# Patient Record
Sex: Male | Born: 1950
Health system: Southern US, Community
[De-identification: ages and names within clinical notes are randomized; demographics above are authoritative.]

## PROBLEM LIST (undated history)

## (undated) DIAGNOSIS — I219 Acute myocardial infarction, unspecified: Secondary | ICD-10-CM

## (undated) DIAGNOSIS — E119 Type 2 diabetes mellitus without complications: Secondary | ICD-10-CM

## (undated) DIAGNOSIS — I499 Cardiac arrhythmia, unspecified: Secondary | ICD-10-CM

## (undated) DIAGNOSIS — N289 Disorder of kidney and ureter, unspecified: Secondary | ICD-10-CM

## (undated) DIAGNOSIS — I10 Essential (primary) hypertension: Secondary | ICD-10-CM

## (undated) DIAGNOSIS — E785 Hyperlipidemia, unspecified: Secondary | ICD-10-CM

## (undated) HISTORY — DX: Hyperlipidemia, unspecified: E78.5

## (undated) HISTORY — DX: Type 2 diabetes mellitus without complications: E11.9

## (undated) HISTORY — DX: Cardiac arrhythmia, unspecified: I49.9

## (undated) HISTORY — DX: Essential (primary) hypertension: I10

## (undated) HISTORY — DX: Disorder of kidney and ureter, unspecified: N28.9

## (undated) HISTORY — DX: Acute myocardial infarction, unspecified: I21.9

## (undated) HISTORY — PX: OTHER SURGICAL HISTORY: SHX169

---

## 2014-12-22 ENCOUNTER — Encounter: Payer: Self-pay | Admitting: *Deleted

## 2014-12-24 ENCOUNTER — Ambulatory Visit: Payer: Self-pay | Admitting: Cardiology

## 2015-03-04 ENCOUNTER — Encounter: Payer: Self-pay | Admitting: Cardiology

## 2015-03-04 ENCOUNTER — Ambulatory Visit (INDEPENDENT_AMBULATORY_CARE_PROVIDER_SITE_OTHER): Payer: 59 | Admitting: Cardiology

## 2015-03-04 VITALS — BP 130/74 | HR 88 | Ht 70.0 in | Wt 276.4 lb

## 2015-03-04 DIAGNOSIS — N058 Unspecified nephritic syndrome with other morphologic changes: Secondary | ICD-10-CM

## 2015-03-04 DIAGNOSIS — E119 Type 2 diabetes mellitus without complications: Secondary | ICD-10-CM | POA: Insufficient documentation

## 2015-03-04 DIAGNOSIS — I481 Persistent atrial fibrillation: Secondary | ICD-10-CM

## 2015-03-04 DIAGNOSIS — Z7901 Long term (current) use of anticoagulants: Secondary | ICD-10-CM

## 2015-03-04 DIAGNOSIS — I2510 Atherosclerotic heart disease of native coronary artery without angina pectoris: Secondary | ICD-10-CM | POA: Diagnosis not present

## 2015-03-04 DIAGNOSIS — E1129 Type 2 diabetes mellitus with other diabetic kidney complication: Secondary | ICD-10-CM

## 2015-03-04 DIAGNOSIS — I2583 Coronary atherosclerosis due to lipid rich plaque: Secondary | ICD-10-CM

## 2015-03-04 DIAGNOSIS — I4819 Other persistent atrial fibrillation: Secondary | ICD-10-CM | POA: Insufficient documentation

## 2015-03-04 DIAGNOSIS — E782 Mixed hyperlipidemia: Secondary | ICD-10-CM

## 2015-03-04 MED ORDER — FENOFIBRATE 54 MG PO TABS: 54.0000 mg | ORAL_TABLET | Freq: Every day | ORAL | Status: DC

## 2015-03-04 NOTE — Patient Instructions (Addendum)
Medication Instructions:  Please decrease Fenofibrate to 54 mg a day. Continue all other medications as listed.  Please have a Digoxin level drawn at your primary care Dr's office at your next visit.  Testing/Procedures: Your physician has requested that you have an echocardiogram. Echocardiography is a painless test that uses sound waves to create images of your heart. It provides your doctor with information about the size and shape of your heart and how well your heart's chambers and valves are working. This procedure takes approximately one hour. There are no restrictions for this procedure.  Follow-Up: Follow up in 6 months with Dr. Marlou Porch.  You will receive a letter in the mail 2 months before you are due.  Please call us when you receive this letter to schedule your follow up appointment.  Thank you for choosing [facility]!!

## 2015-03-04 NOTE — [place] Notes (Signed)
Cardiology Office Note   Date:  03/04/2015   ID:  Shane Kelly, DOB 02/16/1951, MRN [id number]  PCP:  Pcp Not In System  Cardiologist:   Candee Furbish, MD       History of Present Illness: Shane Kelly is a 64 y.o. male who presents for evaluation of coronary artery disease. He has been on chronic anticoagulation, because of chronic atrial fibrillation. He has had a previous myocardial infarction and presumed ischemic cardiomyopathy based upon his spironolactone, digoxin, carvedilol drug regimen. Also has diabetes.  Prior coronary artery stent 2 on 09/25/2006 as well as stent 2 on 01/20/13, [place]. Knee replacement 06/25/13.  He is only had 2 separate episodes of chest discomfort both of which were surrounded by anxiety. Nonexertional. Brief episodes, took nitroglycerin and they were resolved.  Direct LDL 112 HDL 30  and A1c 6.6, BUN 38, creatinine 2.1    Past Medical History  Diagnosis Date  . Diabetes   . Heart attack   . Irregular heart beat   . Hypertension   . Hyperlipidemia   . Kidney disease     Past Surgical History  Procedure Laterality Date  . Right hip replacement    . Stents  2008/2014  . Left knee replacement       Current Outpatient Prescriptions  Medication Sig Dispense Refill  . allopurinol (ZYLOPRIM) 300 MG tablet Take 300 mg by mouth daily.    Marland Kitchen atorvastatin (LIPITOR) 80 MG tablet Take 80 mg by mouth daily.    . bisacodyl (DULCOLAX) 5 MG EC tablet Take 10 mg by mouth daily as needed for moderate constipation.    Marland Kitchen buPROPion (WELLBUTRIN XL) 150 MG 24 hr tablet Take 150 mg by mouth daily.    . carvedilol (COREG) 6.25 MG tablet Take 6.25 mg by mouth 2 (two) times daily with a meal.    . digoxin (LANOXIN) 0.125 MG tablet Take 0.125 mg by mouth daily.    Marland Kitchen diltiazem (CARDIZEM SR) 60 MG 12 hr capsule Take 60 mg by mouth 2 (two) times daily.    . fenofibrate 160 MG tablet Take 160 mg by mouth daily.    . furosemide (LASIX) 20 MG tablet Take  20 mg by mouth.    Marland Kitchen glimepiride (AMARYL) 2 MG tablet Take 2 mg by mouth daily with breakfast.    . HYDROcodone-acetaminophen (NORCO/VICODIN) 5-325 MG per tablet Take 1 tablet by mouth every 6 (six) hours as needed for moderate pain.    Marland Kitchen insulin aspart protamine- aspart (NOVOLOG MIX 70/30) (70-30) 100 UNIT/ML injection Inject into the skin. 18 units AM and 14 units PM    . isosorbide mononitrate (IMDUR) 60 MG 24 hr tablet Take 60 mg by mouth daily.    Marland Kitchen losartan (COZAAR) 100 MG tablet Take 100 mg by mouth daily.    . nitroGLYCERIN (NITROSTAT) 0.4 MG SL tablet Place 0.4 mg under the tongue every 5 (five) minutes as needed for chest pain.    Marland Kitchen spironolactone (ALDACTONE) 25 MG tablet Take 25 mg by mouth daily.    Marland Kitchen warfarin (COUMADIN) 2.5 MG tablet Take 2.5 mg by mouth daily. TAKE 1 TABLET MON, WED, FRI AND A 1/2 TABLET TUES, THURS, SAT AND SUN     No current facility-administered medications for this visit.    Allergies:   Review of patient's allergies indicates no known allergies.    Social History:  The patient  reports that he has never smoked. He does not have any smokeless tobacco  history on file. He reports that he drinks alcohol. He reports that he does not use illicit drugs.   Family History:  The patient's family history includes Diabetes in his father; Heart disease in his father and mother. There is no history of Cancer.    ROS:  Please see the history of present illness.   Otherwise, review of systems are positive for none. He has had a persistent cough for a few weeks. Depression. Back pain, knee pain, irregular heartbeat.  All other systems are reviewed and negative.    PHYSICAL EXAM: VS:  BP 130/74 mmHg  Pulse 88  Ht 5\' 10"  (1.778 m)  Wt 276 lb 6.4 oz (125.374 kg)  BMI 39.66 kg/m2 , BMI Body mass index is 39.66 kg/(m^2). GEN: Well nourished, well developed, in no acute distress HEENT: normal Neck: no JVD, carotid bruits, or masses Cardiac: irreg irreg; no murmurs, rubs,  or gallops,no edema  Respiratory:  clear to auscultation bilaterally, normal work of breathing GI: soft, nontender, nondistended, + BS, obese MS: no deformity or atrophy Skin: warm and dry, no rash, left knee scar Neuro:  Strength and sensation are intact Psych: euthymic mood, full affect   EKG:  EKG is ordered today. The ekg ordered today demonstrates 03/04/15-atrial fibrillation, heart rate 88, PVCs, left anterior fascicular block, personally viewed   Recent Labs: No results found for requested labs within last 365 days.    Lipid Panel No results found for: CHOL, TRIG, HDL, CHOLHDL, VLDL, LDLCALC, LDLDIRECT    Wt Readings from Last 3 Encounters:  03/04/15 276 lb 6.4 oz (125.374 kg)      Other studies Reviewed: Additional studies/ records that were reviewed today include: Lab work, office notes, EKG. Review of the above records demonstrates: as above   ASSESSMENT AND PLAN:  1.  Atrial fibrillation - currently well rate controlled on carvedilol as well as digoxin. Please check a digoxin level, Dr. Nancy Fetter, at clinic appointment (he desire to get the lab work done there since he was going to be getting other lab work done as well.) Check echocardiogram.  2. Chronic anticoagulation-since co-pay is less expensive at Dr. Nancy Fetter, he will talk to them about checking INR. If necessary, we are more than happy to help her with Coumadin checks. Last INR was 2.5.  3. Coronary artery disease-for total stents placed. Last 2014, [place]. Overall doing reasonably well. One bout recent of chest discomfort in the setting of anxiety/stress. If this becomes more apparent, more frequent, he knows to call me and we will set up a stress test for him.  4. Morbid obesity- continue to encourage weight loss. He used to enjoy swimming activities.  5. Diabetes-continue to work on further control.  6. Mixed hyperlipidemia-since his GFR is 34, creatinine 2.1, we will dose adjust Fenofibrate to  54 mg. Spoke with our pharmacist. As diabetes becomes better controlled, triglycerides will become better controlled as well. Encourage weight loss.   Current medicines are reviewed at length with the patient today.  The patient does not have concerns regarding medicines.  The following changes have been made:  Decrased dose of Fenofibrate  Labs/ tests ordered today include: Please check DIGOXIN level at initial appt.   No orders of the defined types were placed in this encounter.     Disposition:   FU with Devone Tousley in 6 months  Signed, Candee Furbish, MD  03/04/2015 4:06 PM    [facility] [place],  [place]  [address] Phone: [phone]; Fax: [phone]

## 2015-03-07 MED ORDER — FENOFIBRATE 54 MG PO TABS: 54.0000 mg | ORAL_TABLET | Freq: Every day | ORAL | Status: DC

## 2015-03-07 NOTE — Addendum Note (Signed)
Addended by: Marlis Edelson C on: 03/07/2015 04:49 PM   Modules accepted: Orders

## 2015-04-06 ENCOUNTER — Other Ambulatory Visit (HOSPITAL_COMMUNITY): Payer: 59

## 2015-04-07 ENCOUNTER — Telehealth (HOSPITAL_COMMUNITY): Payer: Self-pay | Admitting: *Deleted

## 2015-04-22 ENCOUNTER — Ambulatory Visit (HOSPITAL_COMMUNITY): Payer: 59 | Attending: Cardiovascular Disease

## 2015-04-22 ENCOUNTER — Other Ambulatory Visit: Payer: Self-pay

## 2015-04-22 DIAGNOSIS — Z8249 Family history of ischemic heart disease and other diseases of the circulatory system: Secondary | ICD-10-CM | POA: Diagnosis not present

## 2015-04-22 DIAGNOSIS — I517 Cardiomegaly: Secondary | ICD-10-CM | POA: Insufficient documentation

## 2015-04-22 DIAGNOSIS — I481 Persistent atrial fibrillation: Secondary | ICD-10-CM

## 2015-04-22 DIAGNOSIS — I4819 Other persistent atrial fibrillation: Secondary | ICD-10-CM

## 2015-04-22 DIAGNOSIS — I4891 Unspecified atrial fibrillation: Secondary | ICD-10-CM | POA: Insufficient documentation

## 2015-04-22 DIAGNOSIS — E785 Hyperlipidemia, unspecified: Secondary | ICD-10-CM | POA: Insufficient documentation

## 2015-04-22 DIAGNOSIS — I10 Essential (primary) hypertension: Secondary | ICD-10-CM | POA: Diagnosis not present

## 2015-04-22 DIAGNOSIS — E119 Type 2 diabetes mellitus without complications: Secondary | ICD-10-CM | POA: Insufficient documentation

## 2015-09-06 ENCOUNTER — Ambulatory Visit (INDEPENDENT_AMBULATORY_CARE_PROVIDER_SITE_OTHER): Payer: BLUE CROSS/BLUE SHIELD | Admitting: Cardiology

## 2015-09-06 ENCOUNTER — Encounter: Payer: Self-pay | Admitting: Cardiology

## 2015-09-06 VITALS — BP 130/88 | HR 88 | Ht 70.0 in | Wt 286.1 lb

## 2015-09-06 DIAGNOSIS — Z7901 Long term (current) use of anticoagulants: Secondary | ICD-10-CM

## 2015-09-06 DIAGNOSIS — I4819 Other persistent atrial fibrillation: Secondary | ICD-10-CM

## 2015-09-06 DIAGNOSIS — I2510 Atherosclerotic heart disease of native coronary artery without angina pectoris: Secondary | ICD-10-CM

## 2015-09-06 DIAGNOSIS — I2583 Coronary atherosclerosis due to lipid rich plaque: Secondary | ICD-10-CM

## 2015-09-06 DIAGNOSIS — I481 Persistent atrial fibrillation: Secondary | ICD-10-CM

## 2015-09-06 DIAGNOSIS — E782 Mixed hyperlipidemia: Secondary | ICD-10-CM

## 2015-09-06 NOTE — Patient Instructions (Signed)
Medication Instructions:  Your physician recommends that you continue on your current medications as directed. Please refer to the Current Medication list given to you today.   Labwork: None ordered  Testing/Procedures: None ordered  Follow-Up: Your physician wants you to follow-up in: 6 months with Dr.Skains You will receive a reminder letter in the mail two months in advance. If you don't receive a letter, please call our office to schedule the follow-up appointment.   Any Other Special Instructions Will Be Listed Below (If Applicable).     If you need a refill on your cardiac medications before your next appointment, please call your pharmacy.   

## 2015-09-06 NOTE — [place] Notes (Signed)
Cardiology Office Note   Date:  09/06/2015   ID:  Shane Kelly, DOB 1951/03/17, MRN [id number]  PCP:  Lynne Logan, MD  Cardiologist:   Candee Furbish, MD       History of Present Illness: Shane Kelly is a 65 y.o. male who presents for evaluation of coronary artery disease. He has been on chronic anticoagulation, because of chronic atrial fibrillation. He has had a previous myocardial infarction and presumed ischemic cardiomyopathy based upon his spironolactone, digoxin, carvedilol drug regimen. Also has diabetes.  Prior coronary artery stent 2 on 09/25/2006 as well as stent 2 on 01/20/13, [place]. Knee replacement 06/25/13.  He is only had 2 separate episodes of chest discomfort both of which were surrounded by anxiety. Nonexertional. Brief episodes, took nitroglycerin and they were resolved.  No recent episodes. Overall he has been worried about his wife who is going to undergo aortic valve replacement. He has been eating more, stress eating. Weight is increased.  Direct LDL 112 HDL 30  and A1c 6.6, BUN 38, creatinine 2.1. He is getting more recent blood work on the 22nd of this month.    Past Medical History  Diagnosis Date  . Diabetes ([place])   . Heart attack ([place])   . Irregular heart beat   . Hypertension   . Hyperlipidemia   . Kidney disease     Past Surgical History  Procedure Laterality Date  . Right hip replacement    . Stents  2008/2014  . Left knee replacement       Current Outpatient Prescriptions  Medication Sig Dispense Refill  . allopurinol (ZYLOPRIM) 300 MG tablet Take 300 mg by mouth daily.    Marland Kitchen atorvastatin (LIPITOR) 80 MG tablet Take 80 mg by mouth daily.    . bisacodyl (DULCOLAX) 5 MG EC tablet Take 10 mg by mouth daily as needed for moderate constipation.    Marland Kitchen buPROPion (WELLBUTRIN XL) 150 MG 24 hr tablet Take 150 mg by mouth daily.    . carvedilol (COREG) 6.25 MG tablet Take 6.25 mg by mouth 2 (two) times daily with a meal.    .  cetirizine (ZYRTEC) 10 MG tablet Take 10 mg by mouth daily.    . digoxin (LANOXIN) 0.125 MG tablet Take 0.125 mg by mouth daily.    Marland Kitchen diltiazem (CARDIZEM SR) 60 MG 12 hr capsule Take 60 mg by mouth 2 (two) times daily.    . fenofibrate 160 MG tablet Take 160 mg by mouth daily.   0  . furosemide (LASIX) 20 MG tablet Take 20 mg by mouth.    Marland Kitchen glimepiride (AMARYL) 2 MG tablet Take 2 mg by mouth daily with breakfast.    . HYDROcodone-acetaminophen (NORCO/VICODIN) 5-325 MG per tablet Take 1 tablet by mouth every 6 (six) hours as needed for moderate pain.    Marland Kitchen insulin aspart protamine- aspart (NOVOLOG MIX 70/30) (70-30) 100 UNIT/ML injection Inject 30 Units into the skin 2 (two) times daily with a meal. 18 units AM and 14 units PM    . isosorbide mononitrate (IMDUR) 60 MG 24 hr tablet Take 60 mg by mouth daily.    Marland Kitchen losartan (COZAAR) 100 MG tablet Take 100 mg by mouth daily.    . nitroGLYCERIN (NITROSTAT) 0.4 MG SL tablet Place 0.4 mg under the tongue every 5 (five) minutes as needed for chest pain.    Marland Kitchen spironolactone (ALDACTONE) 25 MG tablet Take 25 mg by mouth daily.    Marland Kitchen warfarin (COUMADIN) 2.5 MG  tablet Take 2.5 mg by mouth daily. TAKE 1 TABLET MON, WED, FRI AND A 1/2 TABLET TUES, THURS, SAT AND SUN     No current facility-administered medications for this visit.    Allergies:   Review of patient's allergies indicates no known allergies.    Social History:  The patient  reports that he has never smoked. He does not have any smokeless tobacco history on file. He reports that he drinks alcohol. He reports that he does not use illicit drugs.   Family History:  The patient's family history includes Diabetes in his father; Heart disease in his father and mother. There is no history of Cancer.    ROS:  Please see the history of present illness.   Otherwise, review of systems are positive for none. He has had a persistent cough for a few weeks. Depression. Back pain, knee pain, irregular heartbeat.   All other systems are reviewed and negative.    PHYSICAL EXAM: VS:  BP 130/88 mmHg  Pulse 88  Ht 5\' 10"  (1.778 m)  Wt 286 lb 1.9 oz (129.783 kg)  BMI 41.05 kg/m2  SpO2 98% , BMI Body mass index is 41.05 kg/(m^2). GEN: Well nourished, well developed, in no acute distress HEENT: normal Neck: no JVD, carotid bruits, or masses Cardiac: irreg irreg; no murmurs, rubs, or gallops,no edema  Respiratory:  clear to auscultation bilaterally, normal work of breathing GI: soft, nontender, nondistended, + BS, obese MS: no deformity or atrophy Skin: warm and dry, no rash, left knee scar Neuro:  Strength and sensation are intact Psych: euthymic mood, full affect   EKG:  EKG is ordered today. The ekg ordered today demonstrates 03/04/15-atrial fibrillation, heart rate 88, PVCs, left anterior fascicular block, personally viewed   Recent Labs: No results found for requested labs within last 365 days.    Lipid Panel No results found for: CHOL, TRIG, HDL, CHOLHDL, VLDL, LDLCALC, LDLDIRECT    Wt Readings from Last 3 Encounters:  09/06/15 286 lb 1.9 oz (129.783 kg)  03/04/15 276 lb 6.4 oz (125.374 kg)      Other studies Reviewed: Additional studies/ records that were reviewed today include: Lab work, office notes, EKG. Review of the above records demonstrates: as above   ASSESSMENT AND PLAN:  1.  Atrial fibrillation - currently well rate controlled on carvedilol as well as digoxin. Echocardiogram reassuring, moderate left atrial enlargement overall he is doing well with this. We discussed weight loss.  2. Chronic anticoagulation- Dr. Nancy Fetter, he will talk to them about checking INR.   3. Coronary artery disease - 4 total stents placed. Last 2014, [place]. Overall doing reasonably well. No CP.  4. Morbid obesity- continue to encourage weight loss. He used to enjoy swimming activities. His wife is undergoing aortic valve replacement. Stress eating. He has gained weight.   5.  Diabetes-continue to work on further control.  6. Mixed hyperlipidemia-since his GFR is 34, prior creatinine 2.1, we will dose adjust Fenofibrate to 54 mg. Spoke with our pharmacist.  Encourage weight loss. I think he may have still been taking the 160 mg dosage. Continue to monitor function closely, Dr. Nancy Fetter. Especially with kidney function as well as spironolactone use and digoxin.    Current medicines are reviewed at length with the patient today.  The patient does not have concerns regarding medicines.     No orders of the defined types were placed in this encounter.     Disposition:   FU with [facility]  in 6 months  Signed, Candee Furbish, MD  09/06/2015 9:21 AM    [facility] [place], [place], [place]  [address] Phone: [phone]; Fax: [phone]

## 2015-10-25 DIAGNOSIS — M5431 Sciatica, right side: Secondary | ICD-10-CM | POA: Diagnosis not present

## 2015-10-25 DIAGNOSIS — Z794 Long term (current) use of insulin: Secondary | ICD-10-CM | POA: Diagnosis not present

## 2015-10-25 DIAGNOSIS — Z7984 Long term (current) use of oral hypoglycemic drugs: Secondary | ICD-10-CM | POA: Diagnosis not present

## 2015-10-25 DIAGNOSIS — Z7901 Long term (current) use of anticoagulants: Secondary | ICD-10-CM | POA: Diagnosis not present

## 2015-10-25 DIAGNOSIS — E1122 Type 2 diabetes mellitus with diabetic chronic kidney disease: Secondary | ICD-10-CM | POA: Diagnosis not present

## 2015-10-25 DIAGNOSIS — M25569 Pain in unspecified knee: Secondary | ICD-10-CM | POA: Diagnosis not present

## 2015-11-08 DIAGNOSIS — Z7901 Long term (current) use of anticoagulants: Secondary | ICD-10-CM | POA: Diagnosis not present

## 2015-11-08 DIAGNOSIS — I509 Heart failure, unspecified: Secondary | ICD-10-CM | POA: Diagnosis not present

## 2015-12-15 DIAGNOSIS — Z7901 Long term (current) use of anticoagulants: Secondary | ICD-10-CM | POA: Diagnosis not present

## 2015-12-15 DIAGNOSIS — I4891 Unspecified atrial fibrillation: Secondary | ICD-10-CM | POA: Diagnosis not present

## 2015-12-22 DIAGNOSIS — E1165 Type 2 diabetes mellitus with hyperglycemia: Secondary | ICD-10-CM | POA: Diagnosis not present

## 2015-12-22 DIAGNOSIS — E7800 Pure hypercholesterolemia, unspecified: Secondary | ICD-10-CM | POA: Diagnosis not present

## 2015-12-22 DIAGNOSIS — N189 Chronic kidney disease, unspecified: Secondary | ICD-10-CM | POA: Diagnosis not present

## 2015-12-22 DIAGNOSIS — I10 Essential (primary) hypertension: Secondary | ICD-10-CM | POA: Diagnosis not present

## 2015-12-22 DIAGNOSIS — E669 Obesity, unspecified: Secondary | ICD-10-CM | POA: Diagnosis not present

## 2015-12-29 DIAGNOSIS — E1165 Type 2 diabetes mellitus with hyperglycemia: Secondary | ICD-10-CM | POA: Diagnosis not present

## 2016-02-08 DIAGNOSIS — E1122 Type 2 diabetes mellitus with diabetic chronic kidney disease: Secondary | ICD-10-CM | POA: Diagnosis not present

## 2016-02-08 DIAGNOSIS — I10 Essential (primary) hypertension: Secondary | ICD-10-CM | POA: Diagnosis not present

## 2016-02-08 DIAGNOSIS — E782 Mixed hyperlipidemia: Secondary | ICD-10-CM | POA: Diagnosis not present

## 2016-02-08 DIAGNOSIS — F329 Major depressive disorder, single episode, unspecified: Secondary | ICD-10-CM | POA: Diagnosis not present

## 2016-02-08 DIAGNOSIS — I509 Heart failure, unspecified: Secondary | ICD-10-CM | POA: Diagnosis not present

## 2016-02-08 DIAGNOSIS — Z7901 Long term (current) use of anticoagulants: Secondary | ICD-10-CM | POA: Diagnosis not present

## 2016-02-08 DIAGNOSIS — N183 Chronic kidney disease, stage 3 (moderate): Secondary | ICD-10-CM | POA: Diagnosis not present

## 2016-02-08 DIAGNOSIS — M5431 Sciatica, right side: Secondary | ICD-10-CM | POA: Diagnosis not present

## 2016-02-14 ENCOUNTER — Encounter: Payer: Self-pay | Admitting: Cardiology

## 2016-02-15 DIAGNOSIS — Z7901 Long term (current) use of anticoagulants: Secondary | ICD-10-CM | POA: Diagnosis not present

## 2016-02-15 DIAGNOSIS — I4891 Unspecified atrial fibrillation: Secondary | ICD-10-CM | POA: Diagnosis not present

## 2016-02-24 DIAGNOSIS — I4891 Unspecified atrial fibrillation: Secondary | ICD-10-CM | POA: Diagnosis not present

## 2016-02-24 DIAGNOSIS — Z7901 Long term (current) use of anticoagulants: Secondary | ICD-10-CM | POA: Diagnosis not present

## 2016-03-05 ENCOUNTER — Encounter (INDEPENDENT_AMBULATORY_CARE_PROVIDER_SITE_OTHER): Payer: Self-pay

## 2016-03-05 ENCOUNTER — Encounter: Payer: Self-pay | Admitting: Cardiology

## 2016-03-05 ENCOUNTER — Ambulatory Visit (INDEPENDENT_AMBULATORY_CARE_PROVIDER_SITE_OTHER): Payer: Medicare Other | Admitting: Cardiology

## 2016-03-05 VITALS — BP 116/72 | HR 80 | Ht 70.0 in | Wt 286.8 lb

## 2016-03-05 DIAGNOSIS — I481 Persistent atrial fibrillation: Secondary | ICD-10-CM | POA: Diagnosis not present

## 2016-03-05 DIAGNOSIS — I2583 Coronary atherosclerosis due to lipid rich plaque: Secondary | ICD-10-CM

## 2016-03-05 DIAGNOSIS — I4819 Other persistent atrial fibrillation: Secondary | ICD-10-CM

## 2016-03-05 DIAGNOSIS — I2510 Atherosclerotic heart disease of native coronary artery without angina pectoris: Secondary | ICD-10-CM | POA: Diagnosis not present

## 2016-03-05 DIAGNOSIS — E782 Mixed hyperlipidemia: Secondary | ICD-10-CM

## 2016-03-05 DIAGNOSIS — Z7901 Long term (current) use of anticoagulants: Secondary | ICD-10-CM

## 2016-03-05 NOTE — Patient Instructions (Signed)
Medication Instructions:  You may stop your Digoxin. Continue all other medications as listed.  Follow-Up: Follow up in 6 months with Truitt Merle, NP.  You will receive a letter in the mail 2 months before you are due.  Please call us when you receive this letter to schedule your follow up appointment.  Follow up in 1 year with Dr. Marlou Porch.  You will receive a letter in the mail 2 months before you are due.  Please call us when you receive this letter to schedule your follow up appointment.   If you need a refill on your cardiac medications before your next appointment, please call your pharmacy.  Thank you for choosing [facility]!!

## 2016-03-05 NOTE — [place] Notes (Signed)
Cardiology Office Note   Date:  03/05/2016   ID:  Shane Kelly, DOB 1951/03/13, MRN [id number]  PCP:  Lynne Logan, MD  Cardiologist:   Candee Furbish, MD       History of Present Illness: Shane Kelly is a 65 y.o. male who presents for follow up of coronary artery disease. He has been on chronic anticoagulation, because of chronic atrial fibrillation. He has had a previous myocardial infarction and presumed ischemic cardiomyopathy based upon his spironolactone, digoxin (stopped on 03/05/16), carvedilol drug regimen. Also has diabetes.  Prior coronary artery stent 2 on 09/25/2006 as well as stent 2 on 01/20/13, [place]. Knee replacement 06/25/13.  He is only had 2 separate episodes of chest discomfort both of which were surrounded by anxiety. Nonexertional. Brief episodes, took nitroglycerin and they were resolved.  No recent episodes. His wife underwent aortic valve replacement. He has been eating more, stress eating. Weight is increased.  Direct LDL 112 HDL 30  and A1c 6.6, BUN 38, creatinine 2.1.  Overall no new complaints. No orthopnea, no syncope, no chest pain   Past Medical History:  Diagnosis Date  . Diabetes ([place])   . Heart attack ([facility])   . Hyperlipidemia   . Hypertension   . Irregular heart beat   . Kidney disease     Past Surgical History:  Procedure Laterality Date  . LEFT KNEE REPLACEMENT    . RIGHT HIP REPLACEMENT    . STENTS  2008/2014     Current Outpatient Prescriptions  Medication Sig Dispense Refill  . allopurinol (ZYLOPRIM) 300 MG tablet Take 300 mg by mouth daily.    Marland Kitchen atorvastatin (LIPITOR) 80 MG tablet Take 80 mg by mouth daily.    Marland Kitchen buPROPion (WELLBUTRIN XL) 150 MG 24 hr tablet Take 150 mg by mouth daily.    . carvedilol (COREG) 6.25 MG tablet Take 6.25 mg by mouth 2 (two) times daily with a meal.    . cetirizine (ZYRTEC) 10 MG tablet Take 10 mg by mouth daily.    Marland Kitchen diltiazem (CARDIZEM SR) 60 MG 12 hr capsule Take 60 mg by mouth 2  (two) times daily.    . fenofibrate 160 MG tablet Take 160 mg by mouth daily.   0  . furosemide (LASIX) 20 MG tablet Take 20 mg by mouth.    Marland Kitchen HYDROcodone-acetaminophen (NORCO/VICODIN) 5-325 MG per tablet Take 1 tablet by mouth every 6 (six) hours as needed for moderate pain.    Marland Kitchen insulin aspart protamine- aspart (NOVOLOG MIX 70/30) (70-30) 100 UNIT/ML injection Inject 30 Units into the skin 2 (two) times daily with a meal. 18 units AM and 14 units PM    . isosorbide mononitrate (IMDUR) 60 MG 24 hr tablet Take 60 mg by mouth daily.    Marland Kitchen losartan (COZAAR) 100 MG tablet Take 100 mg by mouth daily.    . nitroGLYCERIN (NITROSTAT) 0.4 MG SL tablet Place 0.4 mg under the tongue every 5 (five) minutes as needed for chest pain.    Marland Kitchen spironolactone (ALDACTONE) 25 MG tablet Take 25 mg by mouth daily.    Marland Kitchen warfarin (COUMADIN) 2.5 MG tablet Take 2.5 mg by mouth daily. TAKE 1 TABLET MON, WED, FRI AND A 1/2 TABLET TUES, THURS, SAT AND SUN     No current facility-administered medications for this visit.     Allergies:   Review of patient's allergies indicates no known allergies.    Social History:  The patient  reports that he has  never smoked. He has never used smokeless tobacco. He reports that he drinks alcohol. He reports that he does not use drugs.   Family History:  The patient's family history includes Diabetes in his father; Heart disease in his father and mother.    ROS:  Please see the history of present illness.   Otherwise, review of systems are positive for none. He has had a persistent cough for a few weeks. Depression. Back pain, knee pain, irregular heartbeat.  All other systems are reviewed and negative.    PHYSICAL EXAM: VS:  BP 116/72   Pulse 80   Ht 5\' 10"  (1.778 m)   Wt 286 lb 12.8 oz (130.1 kg)   BMI 41.15 kg/m  , BMI Body mass index is 41.15 kg/m. GEN: Well nourished, well developed, in no acute distress  HEENT: normal  Neck: no JVD, carotid bruits, or masses Cardiac:  irreg irreg; no murmurs, rubs, or gallops,no edema  Respiratory:  clear to auscultation bilaterally, normal work of breathing GI: soft, nontender, nondistended, + BS, obese MS: no deformity or atrophy  Skin: warm and dry, no rash, left knee scar Neuro:  Strength and sensation are intact Psych: euthymic mood, full affect   EKG:  EKG is ordered today. The ekg ordered today demonstrates 03/05/16-atrial fibrillation, PVCs noted, ventricular rate 72 bpm, left anterior fascicular block personally viewed-no change from prior-03/04/15-atrial fibrillation, heart rate 88, PVCs, left anterior fascicular block, personally viewed   Recent Labs: No results found for requested labs within last 8760 hours.    Lipid Panel No results found for: CHOL, TRIG, HDL, CHOLHDL, VLDL, LDLCALC, LDLDIRECT    Wt Readings from Last 3 Encounters:  03/05/16 286 lb 12.8 oz (130.1 kg)  09/06/15 286 lb 1.9 oz (129.8 kg)  03/04/15 276 lb 6.4 oz (125.4 kg)      Other studies Reviewed: Additional studies/ records that were reviewed today include: Lab work, office notes, EKG. Review of the above records demonstrates: as above   ASSESSMENT AND PLAN:  1.  Atrial fibrillation - currently well rate controlled on carvedilol, Diltiazem as well as digoxin. I'm going to stop his digoxin. We seldom uses this for rate control and atrial fibrillation at this point. Also with his renal function I think this would be wise. Echocardiogram reassuring, moderate left atrial enlargement overall he is doing well with this. We discussed weight loss.  2. Chronic anticoagulation- Dr. Nancy Fetter, INR.   3. Coronary artery disease - 4 total stents placed. Last 2014, [facility]. Overall doing reasonably well. No CP.  4. Morbid obesity- continue to encourage weight loss. He used to enjoy swimming activities. His wife had aortic valve replacement.    5. Diabetes-continue to work on further control. Dr. Chalmers Cater.  6. Mixed  hyperlipidemia-since his GFR is 34, prior creatinine 2.1,  dose adjusted Fenofibrate to 54 mg. Spoke with our pharmacist.  Encourage weight loss. I think he may have still been taking the 160 mg dosage. Continue to monitor function closely, Dr. Nancy Fetter. Especially with kidney function as well as spironolactone use. Stated that it may not be a bad idea for him to see a nephrologist as well.    Current medicines are reviewed at length with the patient today.  The patient does not have concerns regarding medicines.      Orders Placed This Encounter  Procedures  . EKG 12-Lead     Disposition:   FU with Cecille Rubin in 6 months me in one year  Signed,  Candee Furbish, MD  03/05/2016 9:14 AM    [facility] [place], [place], [place]  [address] Phone: [phone]; Fax: [phone]

## 2016-03-07 DIAGNOSIS — I4891 Unspecified atrial fibrillation: Secondary | ICD-10-CM | POA: Diagnosis not present

## 2016-03-07 DIAGNOSIS — Z7901 Long term (current) use of anticoagulants: Secondary | ICD-10-CM | POA: Diagnosis not present

## 2016-03-21 DIAGNOSIS — Z7901 Long term (current) use of anticoagulants: Secondary | ICD-10-CM | POA: Diagnosis not present

## 2016-03-21 DIAGNOSIS — I4891 Unspecified atrial fibrillation: Secondary | ICD-10-CM | POA: Diagnosis not present

## 2016-03-23 DIAGNOSIS — E1165 Type 2 diabetes mellitus with hyperglycemia: Secondary | ICD-10-CM | POA: Diagnosis not present

## 2016-03-23 DIAGNOSIS — I10 Essential (primary) hypertension: Secondary | ICD-10-CM | POA: Diagnosis not present

## 2016-03-23 DIAGNOSIS — N189 Chronic kidney disease, unspecified: Secondary | ICD-10-CM | POA: Diagnosis not present

## 2016-03-23 DIAGNOSIS — E7800 Pure hypercholesterolemia, unspecified: Secondary | ICD-10-CM | POA: Diagnosis not present

## 2016-04-11 ENCOUNTER — Encounter: Payer: Self-pay | Admitting: *Deleted

## 2016-04-12 DIAGNOSIS — I4891 Unspecified atrial fibrillation: Secondary | ICD-10-CM | POA: Diagnosis not present

## 2016-04-12 DIAGNOSIS — Z7901 Long term (current) use of anticoagulants: Secondary | ICD-10-CM | POA: Diagnosis not present

## 2016-05-17 DIAGNOSIS — M5431 Sciatica, right side: Secondary | ICD-10-CM | POA: Diagnosis not present

## 2016-05-17 DIAGNOSIS — Z23 Encounter for immunization: Secondary | ICD-10-CM | POA: Diagnosis not present

## 2016-05-17 DIAGNOSIS — I10 Essential (primary) hypertension: Secondary | ICD-10-CM | POA: Diagnosis not present

## 2016-05-17 DIAGNOSIS — E782 Mixed hyperlipidemia: Secondary | ICD-10-CM | POA: Diagnosis not present

## 2016-05-17 DIAGNOSIS — I4891 Unspecified atrial fibrillation: Secondary | ICD-10-CM | POA: Diagnosis not present

## 2016-05-17 DIAGNOSIS — Z7901 Long term (current) use of anticoagulants: Secondary | ICD-10-CM | POA: Diagnosis not present

## 2016-05-17 DIAGNOSIS — E1122 Type 2 diabetes mellitus with diabetic chronic kidney disease: Secondary | ICD-10-CM | POA: Diagnosis not present

## 2016-05-17 DIAGNOSIS — N183 Chronic kidney disease, stage 3 (moderate): Secondary | ICD-10-CM | POA: Diagnosis not present

## 2016-06-18 DIAGNOSIS — Z7901 Long term (current) use of anticoagulants: Secondary | ICD-10-CM | POA: Diagnosis not present

## 2016-06-18 DIAGNOSIS — I4891 Unspecified atrial fibrillation: Secondary | ICD-10-CM | POA: Diagnosis not present

## 2016-07-13 ENCOUNTER — Other Ambulatory Visit: Payer: Self-pay | Admitting: Nephrology

## 2016-07-13 DIAGNOSIS — I4891 Unspecified atrial fibrillation: Secondary | ICD-10-CM | POA: Diagnosis not present

## 2016-07-13 DIAGNOSIS — E1129 Type 2 diabetes mellitus with other diabetic kidney complication: Secondary | ICD-10-CM | POA: Diagnosis not present

## 2016-07-13 DIAGNOSIS — N183 Chronic kidney disease, stage 3 unspecified: Secondary | ICD-10-CM

## 2016-07-13 DIAGNOSIS — D649 Anemia, unspecified: Secondary | ICD-10-CM | POA: Diagnosis not present

## 2016-07-13 DIAGNOSIS — I10 Essential (primary) hypertension: Secondary | ICD-10-CM | POA: Diagnosis not present

## 2016-07-13 DIAGNOSIS — N2581 Secondary hyperparathyroidism of renal origin: Secondary | ICD-10-CM | POA: Diagnosis not present

## 2016-07-25 ENCOUNTER — Ambulatory Visit
Admission: RE | Admit: 2016-07-25 | Discharge: 2016-07-25 | Disposition: A | Discharge status: Home / Self Care | Payer: Medicare Other | Source: Ambulatory Visit | Attending: Nephrology | Admitting: Nephrology

## 2016-07-25 DIAGNOSIS — Z7901 Long term (current) use of anticoagulants: Secondary | ICD-10-CM | POA: Diagnosis not present

## 2016-07-25 DIAGNOSIS — N183 Chronic kidney disease, stage 3 unspecified: Secondary | ICD-10-CM

## 2016-07-25 DIAGNOSIS — I4891 Unspecified atrial fibrillation: Secondary | ICD-10-CM | POA: Diagnosis not present

## 2016-08-13 DIAGNOSIS — Z7901 Long term (current) use of anticoagulants: Secondary | ICD-10-CM | POA: Diagnosis not present

## 2016-08-13 DIAGNOSIS — I4891 Unspecified atrial fibrillation: Secondary | ICD-10-CM | POA: Diagnosis not present

## 2016-08-20 DIAGNOSIS — N184 Chronic kidney disease, stage 4 (severe): Secondary | ICD-10-CM | POA: Diagnosis not present

## 2016-08-20 DIAGNOSIS — E782 Mixed hyperlipidemia: Secondary | ICD-10-CM | POA: Diagnosis not present

## 2016-08-20 DIAGNOSIS — I4891 Unspecified atrial fibrillation: Secondary | ICD-10-CM | POA: Diagnosis not present

## 2016-08-20 DIAGNOSIS — I8392 Asymptomatic varicose veins of left lower extremity: Secondary | ICD-10-CM | POA: Diagnosis not present

## 2016-08-20 DIAGNOSIS — M1A9XX0 Chronic gout, unspecified, without tophus (tophi): Secondary | ICD-10-CM | POA: Diagnosis not present

## 2016-08-20 DIAGNOSIS — Z7901 Long term (current) use of anticoagulants: Secondary | ICD-10-CM | POA: Diagnosis not present

## 2016-08-20 DIAGNOSIS — I10 Essential (primary) hypertension: Secondary | ICD-10-CM | POA: Diagnosis not present

## 2016-08-20 DIAGNOSIS — I509 Heart failure, unspecified: Secondary | ICD-10-CM | POA: Diagnosis not present

## 2016-08-20 DIAGNOSIS — F329 Major depressive disorder, single episode, unspecified: Secondary | ICD-10-CM | POA: Diagnosis not present

## 2016-08-20 DIAGNOSIS — Z794 Long term (current) use of insulin: Secondary | ICD-10-CM | POA: Diagnosis not present

## 2016-08-20 DIAGNOSIS — E1122 Type 2 diabetes mellitus with diabetic chronic kidney disease: Secondary | ICD-10-CM | POA: Diagnosis not present

## 2016-08-20 DIAGNOSIS — M5431 Sciatica, right side: Secondary | ICD-10-CM | POA: Diagnosis not present

## 2016-09-03 ENCOUNTER — Other Ambulatory Visit: Payer: Self-pay | Admitting: Cardiology

## 2016-09-03 MED ORDER — ISOSORBIDE MONONITRATE ER 60 MG PO TB24: 60.0000 mg | ORAL_TABLET | Freq: Every day | ORAL | 5 refills | Status: DC

## 2016-09-05 ENCOUNTER — Ambulatory Visit: Payer: Medicare Other | Admitting: Nurse Practitioner

## 2016-09-10 DIAGNOSIS — I4891 Unspecified atrial fibrillation: Secondary | ICD-10-CM | POA: Diagnosis not present

## 2016-09-10 DIAGNOSIS — Z7901 Long term (current) use of anticoagulants: Secondary | ICD-10-CM | POA: Diagnosis not present

## 2016-09-12 ENCOUNTER — Ambulatory Visit: Payer: Medicare Other | Admitting: Nurse Practitioner

## 2016-09-17 NOTE — Progress Notes (Addendum)
CARDIOLOGY OFFICE NOTE  Date:  09/18/2016    Shane Kelly Date of Birth: 03-01-1951 Medical Record #[id number]  PCP:  Lynne Logan, MD  Cardiologist:  [facility]  Chief Complaint  Patient presents with  . Coronary Artery Disease  . Cardiomyopathy    6 month check - seen for Dr. Marlou Porch    History of Present Illness: Shane Kelly is a 66 y.o. male who presents today for a 6 month check. Seen for Dr. Marlou Porch.  He has a history of CAD with presumed prior MI, chronic AF, DM, chronic anticoagulation and an ischemic CM.  Prior coronary artery stent 2 on 09/25/2006 as well as stent 2 on 01/20/13 in [place]. Echo from 2016 shows normal EF. INR is followed by PCP.   Last seen here back in August - some stress with wife having had AVR. Cardiac status was felt to be stable.   Comes in today. Here alone. Good 6 months. No chest pain. Breathing is good. Babysitting grand kid. He is working on a motorcycle. No real exercise program whatsoever - weight continues to climb. He is followed now by [facility] as well. Has had recent labs by his PCP. Tolerating his medicines. Has no real complaint. Not dizzy or lightheaded. No syncope. He feels like he is doing ok for the most part.   Past Medical History:  Diagnosis Date  . Diabetes ([place])   . Heart attack   . Hyperlipidemia   . Hypertension   . Irregular heart beat   . Kidney disease     Past Surgical History:  Procedure Laterality Date  . LEFT KNEE REPLACEMENT    . RIGHT HIP REPLACEMENT    . STENTS  2008/2014     Medications: Current Outpatient Prescriptions  Medication Sig Dispense Refill  . allopurinol (ZYLOPRIM) 300 MG tablet Take 300 mg by mouth daily.    Marland Kitchen atorvastatin (LIPITOR) 80 MG tablet Take 80 mg by mouth daily.    Marland Kitchen buPROPion (WELLBUTRIN XL) 150 MG 24 hr tablet Take 150 mg by mouth daily.    . carvedilol (COREG) 6.25 MG tablet Take 6.25 mg by mouth 2 (two) times daily with a meal.    . diltiazem (CARDIZEM  SR) 60 MG 12 hr capsule Take 60 mg by mouth 2 (two) times daily.    Marland Kitchen docusate sodium (COLACE) 100 MG capsule Take 100 mg by mouth daily as needed for mild constipation.    . fenofibrate 54 MG tablet Take 54 mg by mouth daily.    . furosemide (LASIX) 20 MG tablet Take 20 mg by mouth.    Marland Kitchen HYDROcodone-acetaminophen (NORCO/VICODIN) 5-325 MG per tablet Take 1 tablet by mouth every 6 (six) hours as needed for moderate pain.    Marland Kitchen insulin aspart protamine- aspart (NOVOLOG MIX 70/30) (70-30) 100 UNIT/ML injection Inject 30 Units into the skin 2 (two) times daily with a meal. 25 units AM and 20 units PM    . isosorbide mononitrate (IMDUR) 60 MG 24 hr tablet Take 1 tablet (60 mg total) by mouth daily. 30 tablet 5  . losartan (COZAAR) 100 MG tablet Take 100 mg by mouth daily.    . nitroGLYCERIN (NITROSTAT) 0.4 MG SL tablet Place 0.4 mg under the tongue every 5 (five) minutes as needed for chest pain.    Marland Kitchen spironolactone (ALDACTONE) 25 MG tablet Take 25 mg by mouth daily.    Marland Kitchen warfarin (COUMADIN) 2.5 MG tablet Take 2.5 mg by mouth daily. TAKE 1  TABLET MON, WED, FRI AND A 1/2 TABLET TUES, THURS, SAT AND SUN     No current facility-administered medications for this visit.     Allergies: No Known Allergies  Social History: The patient  reports that he has never smoked. He has never used smokeless tobacco. He reports that he drinks alcohol. He reports that he does not use drugs.   Family History: The patient's family history includes Diabetes in his father; Heart disease in his father and mother.   Review of Systems: Please see the history of present illness.   Otherwise, the review of systems is positive for none.   All other systems are reviewed and negative.   Physical Exam: VS:  BP 120/84   Pulse 60   Ht 5\' 10"  (1.778 m)   Wt 294 lb 1.9 oz (133.4 kg)   BMI 42.20 kg/m  .  BMI Body mass index is 42.2 kg/m.  Wt Readings from Last 3 Encounters:  09/18/16 294 lb 1.9 oz (133.4 kg)  03/05/16 286  lb 12.8 oz (130.1 kg)  09/06/15 286 lb 1.9 oz (129.8 kg)    General: Pleasant. Morbidly obese. Alert and in no acute distress.   HEENT: Normal.  Neck: Supple, no JVD, carotid bruits, or masses noted.  Cardiac: Irregular irregular rhythm. His rate is fine.  No murmurs, rubs, or gallops. No edema.  Respiratory:  Lungs are clear to auscultation bilaterally with normal work of breathing.  GI: Soft and nontender.  Obese.  MS: No deformity or atrophy. Gait and ROM intact.  Skin: Warm and dry. Color is normal.  Neuro:  Strength and sensation are intact and no gross focal deficits noted.  Psych: Alert, appropriate and with normal affect.   LABORATORY DATA:  EKG:  EKG is not ordered today.  No results found for: WBC, HGB, HCT, PLT, GLUCOSE, CHOL, TRIG, HDL, LDLDIRECT, LDLCALC, ALT, AST, NA, K, CL, CREATININE, BUN, CO2, TSH, PSA, INR, GLUF, HGBA1C, MICROALBUR  BNP (last 3 results) No results for input(s): BNP in the last 8760 hours.  ProBNP (last 3 results) No results for input(s): PROBNP in the last 8760 hours.   Other Studies Reviewed Today:  Echo Study Conclusions from 2016  - Left ventricle: The cavity size was normal. Wall thickness was   increased in a pattern of mild LVH. Systolic function was normal.   The estimated ejection fraction was in the range of 55% to 60%. - Left atrium: The atrium was moderately dilated. - Atrial septum: No defect or patent foramen ovale was identified.   Assessment/Plan: 1.  Chronic atrial fibrillation - managed with rate control and anticoagulation.   2. Chronic anticoagulation- monitored by Dr. Nancy Fetter, INR. Does not really seem interested in [place] but this could be an option for him. No bleeding/falls noted.   3. Coronary artery disease - 4 total stents placed. Last 2014, [place]. Overall doing reasonably well. No CP. I have left him on his current regimen. Needs CV risk factor modification. Discussed at length with him.   4.  Morbid obesity- continue to encourage weight loss.   5. Diabetes - sees Dr. Chalmers Cater.  6. Mixed hyperlipidemia- labs by PCP  7. CKD - now followed by nephrology.  Current medicines are reviewed with the patient today.  The patient does not have concerns regarding medicines other than what has been noted above.  The following changes have been made:  See above.  Labs/ tests ordered today include:   No orders of  the defined types were placed in this encounter.    Disposition:   FU with Dr. Marlou Porch in 6 months.   Patient is agreeable to this plan and will call if any problems develop in the interim.   SignedTruitt Merle, NP  09/18/2016 8:42 AM  [facility] [address] [place], [place]  [address] Phone: [phone] Fax: [phone]       Addendum: Received copy of his lipids from his PCP - noted LDL is creeping up - on max dose of Lipitor and was on fenofibrate. Fenofibrate stopped due to his kidney issues and he was started on Zetia. I would agree with this plan.  Burtis Junes, RN, [facility] [address] [place], [place]  [address] [phone]

## 2016-09-18 ENCOUNTER — Encounter: Payer: Self-pay | Admitting: Nurse Practitioner

## 2016-09-18 ENCOUNTER — Encounter (INDEPENDENT_AMBULATORY_CARE_PROVIDER_SITE_OTHER): Payer: Self-pay

## 2016-09-18 ENCOUNTER — Ambulatory Visit (INDEPENDENT_AMBULATORY_CARE_PROVIDER_SITE_OTHER): Payer: Medicare Other | Admitting: Nurse Practitioner

## 2016-09-18 VITALS — BP 120/84 | HR 60 | Ht 70.0 in | Wt 294.1 lb

## 2016-09-18 DIAGNOSIS — I481 Persistent atrial fibrillation: Secondary | ICD-10-CM | POA: Diagnosis not present

## 2016-09-18 DIAGNOSIS — Z7901 Long term (current) use of anticoagulants: Secondary | ICD-10-CM

## 2016-09-18 DIAGNOSIS — I4819 Other persistent atrial fibrillation: Secondary | ICD-10-CM

## 2016-09-18 DIAGNOSIS — I2510 Atherosclerotic heart disease of native coronary artery without angina pectoris: Secondary | ICD-10-CM | POA: Diagnosis not present

## 2016-09-18 DIAGNOSIS — I2583 Coronary atherosclerosis due to lipid rich plaque: Secondary | ICD-10-CM | POA: Diagnosis not present

## 2016-09-18 NOTE — Patient Instructions (Addendum)
We will be checking the following labs today - NONE   Medication Instructions:    Continue with your current medicines.     Testing/Procedures To Be Arranged:  N/A  Follow-Up:   See Dr. Marlou Porch in 6 months    Other Special Instructions:   Try to get more activity!    If you need a refill on your cardiac medications before your next appointment, please call your pharmacy.   Call the [place] office at [phone] if you have any questions, problems or concerns.

## 2016-10-08 DIAGNOSIS — I4891 Unspecified atrial fibrillation: Secondary | ICD-10-CM | POA: Diagnosis not present

## 2016-10-08 DIAGNOSIS — E1129 Type 2 diabetes mellitus with other diabetic kidney complication: Secondary | ICD-10-CM | POA: Diagnosis not present

## 2016-10-08 DIAGNOSIS — I10 Essential (primary) hypertension: Secondary | ICD-10-CM | POA: Diagnosis not present

## 2016-10-08 DIAGNOSIS — N183 Chronic kidney disease, stage 3 (moderate): Secondary | ICD-10-CM | POA: Diagnosis not present

## 2016-10-15 DIAGNOSIS — I4891 Unspecified atrial fibrillation: Secondary | ICD-10-CM | POA: Diagnosis not present

## 2016-10-15 DIAGNOSIS — Z7901 Long term (current) use of anticoagulants: Secondary | ICD-10-CM | POA: Diagnosis not present

## 2016-11-13 DIAGNOSIS — Z7901 Long term (current) use of anticoagulants: Secondary | ICD-10-CM | POA: Diagnosis not present

## 2016-11-13 DIAGNOSIS — I4891 Unspecified atrial fibrillation: Secondary | ICD-10-CM | POA: Diagnosis not present

## 2016-11-20 DIAGNOSIS — I4891 Unspecified atrial fibrillation: Secondary | ICD-10-CM | POA: Diagnosis not present

## 2016-11-20 DIAGNOSIS — Z7901 Long term (current) use of anticoagulants: Secondary | ICD-10-CM | POA: Diagnosis not present

## 2016-11-23 DIAGNOSIS — M1A9XX0 Chronic gout, unspecified, without tophus (tophi): Secondary | ICD-10-CM | POA: Diagnosis not present

## 2016-11-23 DIAGNOSIS — I10 Essential (primary) hypertension: Secondary | ICD-10-CM | POA: Diagnosis not present

## 2016-11-23 DIAGNOSIS — M5431 Sciatica, right side: Secondary | ICD-10-CM | POA: Diagnosis not present

## 2016-11-23 DIAGNOSIS — N184 Chronic kidney disease, stage 4 (severe): Secondary | ICD-10-CM | POA: Diagnosis not present

## 2016-11-23 DIAGNOSIS — E1122 Type 2 diabetes mellitus with diabetic chronic kidney disease: Secondary | ICD-10-CM | POA: Diagnosis not present

## 2016-11-23 DIAGNOSIS — I8392 Asymptomatic varicose veins of left lower extremity: Secondary | ICD-10-CM | POA: Diagnosis not present

## 2016-11-23 DIAGNOSIS — E782 Mixed hyperlipidemia: Secondary | ICD-10-CM | POA: Diagnosis not present

## 2016-11-23 DIAGNOSIS — I509 Heart failure, unspecified: Secondary | ICD-10-CM | POA: Diagnosis not present

## 2016-11-23 DIAGNOSIS — Z7901 Long term (current) use of anticoagulants: Secondary | ICD-10-CM | POA: Diagnosis not present

## 2016-11-23 DIAGNOSIS — F339 Major depressive disorder, recurrent, unspecified: Secondary | ICD-10-CM | POA: Diagnosis not present

## 2016-11-23 DIAGNOSIS — I4891 Unspecified atrial fibrillation: Secondary | ICD-10-CM | POA: Diagnosis not present

## 2016-12-18 DIAGNOSIS — I4891 Unspecified atrial fibrillation: Secondary | ICD-10-CM | POA: Diagnosis not present

## 2016-12-18 DIAGNOSIS — Z7901 Long term (current) use of anticoagulants: Secondary | ICD-10-CM | POA: Diagnosis not present

## 2016-12-26 ENCOUNTER — Ambulatory Visit: Payer: Medicare Other | Admitting: Cardiology

## 2017-01-03 DIAGNOSIS — F329 Major depressive disorder, single episode, unspecified: Secondary | ICD-10-CM | POA: Diagnosis not present

## 2017-01-03 DIAGNOSIS — E1129 Type 2 diabetes mellitus with other diabetic kidney complication: Secondary | ICD-10-CM | POA: Diagnosis not present

## 2017-01-03 DIAGNOSIS — I10 Essential (primary) hypertension: Secondary | ICD-10-CM | POA: Diagnosis not present

## 2017-01-03 DIAGNOSIS — N183 Chronic kidney disease, stage 3 (moderate): Secondary | ICD-10-CM | POA: Diagnosis not present

## 2017-01-03 DIAGNOSIS — I4891 Unspecified atrial fibrillation: Secondary | ICD-10-CM | POA: Diagnosis not present

## 2017-01-10 DIAGNOSIS — Z7901 Long term (current) use of anticoagulants: Secondary | ICD-10-CM | POA: Diagnosis not present

## 2017-01-10 DIAGNOSIS — I4891 Unspecified atrial fibrillation: Secondary | ICD-10-CM | POA: Diagnosis not present

## 2017-01-17 DIAGNOSIS — Z7901 Long term (current) use of anticoagulants: Secondary | ICD-10-CM | POA: Diagnosis not present

## 2017-01-17 DIAGNOSIS — I4891 Unspecified atrial fibrillation: Secondary | ICD-10-CM | POA: Diagnosis not present

## 2017-02-05 ENCOUNTER — Encounter (INDEPENDENT_AMBULATORY_CARE_PROVIDER_SITE_OTHER): Payer: Self-pay

## 2017-02-05 ENCOUNTER — Ambulatory Visit (INDEPENDENT_AMBULATORY_CARE_PROVIDER_SITE_OTHER): Payer: Medicare Other | Admitting: Cardiology

## 2017-02-05 ENCOUNTER — Encounter: Payer: Self-pay | Admitting: Cardiology

## 2017-02-05 VITALS — BP 114/80 | HR 86 | Ht 70.0 in | Wt 288.8 lb

## 2017-02-05 DIAGNOSIS — E119 Type 2 diabetes mellitus without complications: Secondary | ICD-10-CM

## 2017-02-05 DIAGNOSIS — I10 Essential (primary) hypertension: Secondary | ICD-10-CM | POA: Diagnosis not present

## 2017-02-05 DIAGNOSIS — I481 Persistent atrial fibrillation: Secondary | ICD-10-CM | POA: Diagnosis not present

## 2017-02-05 DIAGNOSIS — Z7901 Long term (current) use of anticoagulants: Secondary | ICD-10-CM

## 2017-02-05 DIAGNOSIS — I2510 Atherosclerotic heart disease of native coronary artery without angina pectoris: Secondary | ICD-10-CM | POA: Diagnosis not present

## 2017-02-05 DIAGNOSIS — I4819 Other persistent atrial fibrillation: Secondary | ICD-10-CM

## 2017-02-05 DIAGNOSIS — I2583 Coronary atherosclerosis due to lipid rich plaque: Secondary | ICD-10-CM | POA: Diagnosis not present

## 2017-02-05 MED ORDER — NITROGLYCERIN 0.4 MG SL SUBL: 0.4000 mg | SUBLINGUAL_TABLET | SUBLINGUAL | 99 refills | Status: DC | PRN

## 2017-02-05 NOTE — Progress Notes (Signed)
Cardiology Office Note   Date:  02/05/2017   ID:  Shane Kelly, DOB June 07, 1951, MRN [id number]  PCP:  Donald Prose, MD  Cardiologist:   Candee Furbish, MD       History of Present Illness: Shane Kelly is a 66 y.o. male who presents for follow up of coronary artery disease. He has been on chronic anticoagulation, because of chronic atrial fibrillation. He has had a previous myocardial infarction and presumed ischemic cardiomyopathy based upon his spironolactone, digoxin (stopped on 03/05/16), carvedilol drug regimen. Also has diabetes.  Prior coronary artery stent 2 on 09/25/2006 as well as stent 2 on 01/20/13, [place]. Knee replacement 06/25/13.  He is only had 2 separate episodes of chest discomfort both of which were surrounded by anxiety. Nonexertional. Brief episodes, took nitroglycerin and they were resolved.  No recent episodes. His wife underwent aortic valve replacement. He has been eating more, stress eating. Weight is increased.  Direct LDL 112 HDL 30  and A1c 6.6, BUN 38, creatinine 2.1.  Seems to be doing quite well, no chest pain can no angina, no shortness of breath, no syncopal. Positive varicose veins chronic.   Past Medical History:  Diagnosis Date  . Diabetes ([place])   . Heart attack ([place])   . Hyperlipidemia   . Hypertension   . Irregular heart beat   . Kidney disease     Past Surgical History:  Procedure Laterality Date  . LEFT KNEE REPLACEMENT    . RIGHT HIP REPLACEMENT    . STENTS  2008/2014     Current Outpatient Prescriptions  Medication Sig Dispense Refill  . allopurinol (ZYLOPRIM) 300 MG tablet Take 300 mg by mouth daily.    Marland Kitchen atorvastatin (LIPITOR) 80 MG tablet Take 80 mg by mouth daily.    Marland Kitchen buPROPion (WELLBUTRIN XL) 150 MG 24 hr tablet Take 150 mg by mouth daily.    . carvedilol (COREG) 6.25 MG tablet Take 6.25 mg by mouth 2 (two) times daily with a meal.    . diltiazem (CARDIZEM SR) 60 MG 12 hr capsule Take 60 mg by mouth 2 (two)  times daily.    Marland Kitchen docusate sodium (COLACE) 100 MG capsule Take 100 mg by mouth daily as needed for mild constipation.    . fenofibrate 54 MG tablet Take 54 mg by mouth daily.    . furosemide (LASIX) 20 MG tablet Take 20 mg by mouth.    Marland Kitchen HYDROcodone-acetaminophen (NORCO/VICODIN) 5-325 MG per tablet Take 1 tablet by mouth every 6 (six) hours as needed for moderate pain.    Marland Kitchen insulin aspart protamine- aspart (NOVOLOG MIX 70/30) (70-30) 100 UNIT/ML injection Inject 30 Units into the skin 2 (two) times daily with a meal. 25 units AM and 20 units PM    . isosorbide mononitrate (IMDUR) 60 MG 24 hr tablet Take 1 tablet (60 mg total) by mouth daily. 30 tablet 5  . losartan (COZAAR) 100 MG tablet Take 100 mg by mouth daily.    . nitroGLYCERIN (NITROSTAT) 0.4 MG SL tablet Place 1 tablet (0.4 mg total) under the tongue every 5 (five) minutes as needed for chest pain. 25 tablet prn  . spironolactone (ALDACTONE) 25 MG tablet Take 25 mg by mouth daily.    Marland Kitchen warfarin (COUMADIN) 2.5 MG tablet Take 2.5 mg by mouth daily. TAKE 1 TABLET MON, WED, FRI AND A 1/2 TABLET TUES, THURS, SAT AND SUN     No current facility-administered medications for this visit.  Allergies:   Patient has no known allergies.    Social History:  The patient  reports that he has never smoked. He has never used smokeless tobacco. He reports that he drinks alcohol. He reports that he does not use drugs.   Family History:  The patient's family history includes Diabetes in his father; Heart disease in his father and mother.    ROS:  Please see the history of present illness.   Otherwise, review of systems are positive for none. He has had a persistent cough for a few weeks. Depression. Back pain, knee pain, irregular heartbeat.  All other systems are reviewed and negative.    PHYSICAL EXAM: VS:  BP 114/80   Pulse 86   Ht 5\' 10"  (1.778 m)   Wt 288 lb 12.8 oz (131 kg)   BMI 41.44 kg/m  , BMI Body mass index is 41.44 kg/m. GEN:  Well nourished, well developed, in no acute distress  HEENT: normal  Neck: no JVD, carotid bruits, or masses Cardiac: irreg irreg; no murmurs, rubs, or gallops,no edema  Respiratory:  clear to auscultation bilaterally, normal work of breathing GI: soft, nontender, nondistended, + BS, obese MS: no deformity or atrophy  Skin: warm and dry, no rash, left knee scar Neuro:  Strength and sensation are intact Psych: euthymic mood, full affect Unchanged from prior physical exam as above.  EKG:  EKG is ordered today. The ekg ordered today demonstrates 02/05/17 - atrial fibrillation heart rate 86 with left anterior fascicular block -03/05/16-atrial fibrillation, PVCs noted, ventricular rate 72 bpm, left anterior fascicular block personally viewed-no change from prior-03/04/15-atrial fibrillation, heart rate 88, PVCs, left anterior fascicular block, personally viewed.    Recent Labs: No results found for requested labs within last 8760 hours.    Lipid Panel No results found for: CHOL, TRIG, HDL, CHOLHDL, VLDL, LDLCALC, LDLDIRECT    Wt Readings from Last 3 Encounters:  02/05/17 288 lb 12.8 oz (131 kg)  09/18/16 294 lb 1.9 oz (133.4 kg)  03/05/16 286 lb 12.8 oz (130.1 kg)      Other studies Reviewed: Additional studies/ records that were reviewed today include: Lab work, office notes, EKG. Review of the above records demonstrates: as above   ASSESSMENT AND PLAN:  1.  Atrial fibrillation - currently well rate controlled on carvedilol, Diltiazem. Doing well off of the digoxin Echocardiogram reassuring, moderate left atrial enlargement overall he is doing well with this. We discussed weight loss.  2. Chronic anticoagulation- Dr. Nancy Fetter, INR.   3. Coronary artery disease - 4 total stents placed. Last 2014, [place]. Overall doing reasonably well. No CP. Stable  4. Morbid obesity- continue to encourage weight loss. He used to enjoy swimming activities. His wife had aortic valve  replacement.  Would be great if he could loose between 80-100 pounds.  5. Diabetes-continue to work on further control. Dr. Chalmers Cater.  6. Mixed hyperlipidemia-since his GFR is 34, prior creatinine 2.1-2.4,  dose adjusted Fenofibrate to 54 mg.   Encourage weight loss. I think he may have still been taking the 160 mg dosage. Continue to monitor function closely, Dr. Nancy Fetter. Especially with kidney function as well as spironolactone use. [facility].    Current medicines are reviewed at length with the patient today.  The patient does not have concerns regarding medicines.      Orders Placed This Encounter  Procedures  . EKG 12-Lead     Disposition:   FU with Cecille Rubin in 6 months me in  one year  Signed, Candee Furbish, MD  02/05/2017 9:00 AM    [facility], [place], [place]  [address] Phone: [phone]; Fax: [phone]

## 2017-02-05 NOTE — Patient Instructions (Addendum)
Medication Instructions:  The current medical regimen is effective;  continue present plan and medications.  Follow-Up: Follow up in 6 months with Lori Gerhardt, NP.  You will receive a letter in the mail 2 months before you are due.  Please call us when you receive this letter to schedule your follow up appointment.  Follow up in 1 year with Dr. Skains.  You will receive a letter in the mail 2 months before you are due.  Please call us when you receive this letter to schedule your follow up appointment.  If you need a refill on your cardiac medications before your next appointment, please call your pharmacy.  Thank you for choosing [facility]!!     

## 2017-02-14 DIAGNOSIS — Z7901 Long term (current) use of anticoagulants: Secondary | ICD-10-CM | POA: Diagnosis not present

## 2017-02-14 DIAGNOSIS — I4891 Unspecified atrial fibrillation: Secondary | ICD-10-CM | POA: Diagnosis not present

## 2017-03-11 ENCOUNTER — Other Ambulatory Visit: Payer: Self-pay | Admitting: Cardiology

## 2017-03-13 DIAGNOSIS — I4891 Unspecified atrial fibrillation: Secondary | ICD-10-CM | POA: Diagnosis not present

## 2017-03-13 DIAGNOSIS — G894 Chronic pain syndrome: Secondary | ICD-10-CM | POA: Diagnosis not present

## 2017-03-13 DIAGNOSIS — N183 Chronic kidney disease, stage 3 (moderate): Secondary | ICD-10-CM | POA: Diagnosis not present

## 2017-03-13 DIAGNOSIS — M5431 Sciatica, right side: Secondary | ICD-10-CM | POA: Diagnosis not present

## 2017-03-13 DIAGNOSIS — E782 Mixed hyperlipidemia: Secondary | ICD-10-CM | POA: Diagnosis not present

## 2017-03-13 DIAGNOSIS — Z7901 Long term (current) use of anticoagulants: Secondary | ICD-10-CM | POA: Diagnosis not present

## 2017-03-13 DIAGNOSIS — Z794 Long term (current) use of insulin: Secondary | ICD-10-CM | POA: Diagnosis not present

## 2017-03-13 DIAGNOSIS — I2510 Atherosclerotic heart disease of native coronary artery without angina pectoris: Secondary | ICD-10-CM | POA: Diagnosis not present

## 2017-03-13 DIAGNOSIS — F329 Major depressive disorder, single episode, unspecified: Secondary | ICD-10-CM | POA: Diagnosis not present

## 2017-03-13 DIAGNOSIS — I10 Essential (primary) hypertension: Secondary | ICD-10-CM | POA: Diagnosis not present

## 2017-03-13 DIAGNOSIS — E1122 Type 2 diabetes mellitus with diabetic chronic kidney disease: Secondary | ICD-10-CM | POA: Diagnosis not present

## 2017-03-13 DIAGNOSIS — Z1389 Encounter for screening for other disorder: Secondary | ICD-10-CM | POA: Diagnosis not present

## 2017-03-27 DIAGNOSIS — Z7901 Long term (current) use of anticoagulants: Secondary | ICD-10-CM | POA: Diagnosis not present

## 2017-03-27 DIAGNOSIS — I4891 Unspecified atrial fibrillation: Secondary | ICD-10-CM | POA: Diagnosis not present

## 2017-04-25 DIAGNOSIS — I4891 Unspecified atrial fibrillation: Secondary | ICD-10-CM | POA: Diagnosis not present

## 2017-04-25 DIAGNOSIS — Z7901 Long term (current) use of anticoagulants: Secondary | ICD-10-CM | POA: Diagnosis not present

## 2017-05-16 DIAGNOSIS — Z7901 Long term (current) use of anticoagulants: Secondary | ICD-10-CM | POA: Diagnosis not present

## 2017-05-16 DIAGNOSIS — I4891 Unspecified atrial fibrillation: Secondary | ICD-10-CM | POA: Diagnosis not present

## 2017-05-23 DIAGNOSIS — Z23 Encounter for immunization: Secondary | ICD-10-CM | POA: Diagnosis not present

## 2017-05-23 DIAGNOSIS — I4891 Unspecified atrial fibrillation: Secondary | ICD-10-CM | POA: Diagnosis not present

## 2017-05-23 DIAGNOSIS — Z7901 Long term (current) use of anticoagulants: Secondary | ICD-10-CM | POA: Diagnosis not present

## 2017-06-10 DIAGNOSIS — I4891 Unspecified atrial fibrillation: Secondary | ICD-10-CM | POA: Diagnosis not present

## 2017-06-10 DIAGNOSIS — Z7901 Long term (current) use of anticoagulants: Secondary | ICD-10-CM | POA: Diagnosis not present

## 2017-06-17 DIAGNOSIS — I4891 Unspecified atrial fibrillation: Secondary | ICD-10-CM | POA: Diagnosis not present

## 2017-06-17 DIAGNOSIS — Z7901 Long term (current) use of anticoagulants: Secondary | ICD-10-CM | POA: Diagnosis not present

## 2017-06-19 DIAGNOSIS — N2581 Secondary hyperparathyroidism of renal origin: Secondary | ICD-10-CM | POA: Diagnosis not present

## 2017-06-19 DIAGNOSIS — E1122 Type 2 diabetes mellitus with diabetic chronic kidney disease: Secondary | ICD-10-CM | POA: Diagnosis not present

## 2017-06-19 DIAGNOSIS — N183 Chronic kidney disease, stage 3 (moderate): Secondary | ICD-10-CM | POA: Diagnosis not present

## 2017-06-19 DIAGNOSIS — I129 Hypertensive chronic kidney disease with stage 1 through stage 4 chronic kidney disease, or unspecified chronic kidney disease: Secondary | ICD-10-CM | POA: Diagnosis not present

## 2017-06-19 DIAGNOSIS — I4891 Unspecified atrial fibrillation: Secondary | ICD-10-CM | POA: Diagnosis not present

## 2017-06-24 DIAGNOSIS — Z7901 Long term (current) use of anticoagulants: Secondary | ICD-10-CM | POA: Diagnosis not present

## 2017-06-24 DIAGNOSIS — I4891 Unspecified atrial fibrillation: Secondary | ICD-10-CM | POA: Diagnosis not present

## 2017-07-10 DIAGNOSIS — I4891 Unspecified atrial fibrillation: Secondary | ICD-10-CM | POA: Diagnosis not present

## 2017-07-10 DIAGNOSIS — Z7901 Long term (current) use of anticoagulants: Secondary | ICD-10-CM | POA: Diagnosis not present

## 2017-08-07 DIAGNOSIS — I4891 Unspecified atrial fibrillation: Secondary | ICD-10-CM | POA: Diagnosis not present

## 2017-08-07 DIAGNOSIS — Z7901 Long term (current) use of anticoagulants: Secondary | ICD-10-CM | POA: Diagnosis not present

## 2017-08-08 DIAGNOSIS — G894 Chronic pain syndrome: Secondary | ICD-10-CM | POA: Diagnosis not present

## 2017-08-08 DIAGNOSIS — I10 Essential (primary) hypertension: Secondary | ICD-10-CM | POA: Diagnosis not present

## 2017-08-08 DIAGNOSIS — Z23 Encounter for immunization: Secondary | ICD-10-CM | POA: Diagnosis not present

## 2017-08-08 DIAGNOSIS — I2510 Atherosclerotic heart disease of native coronary artery without angina pectoris: Secondary | ICD-10-CM | POA: Diagnosis not present

## 2017-08-08 DIAGNOSIS — N184 Chronic kidney disease, stage 4 (severe): Secondary | ICD-10-CM | POA: Diagnosis not present

## 2017-08-08 DIAGNOSIS — E782 Mixed hyperlipidemia: Secondary | ICD-10-CM | POA: Diagnosis not present

## 2017-08-08 DIAGNOSIS — M1A9XX0 Chronic gout, unspecified, without tophus (tophi): Secondary | ICD-10-CM | POA: Diagnosis not present

## 2017-08-08 DIAGNOSIS — F329 Major depressive disorder, single episode, unspecified: Secondary | ICD-10-CM | POA: Diagnosis not present

## 2017-08-08 DIAGNOSIS — I509 Heart failure, unspecified: Secondary | ICD-10-CM | POA: Diagnosis not present

## 2017-08-08 DIAGNOSIS — Z794 Long term (current) use of insulin: Secondary | ICD-10-CM | POA: Diagnosis not present

## 2017-08-08 DIAGNOSIS — I4891 Unspecified atrial fibrillation: Secondary | ICD-10-CM | POA: Diagnosis not present

## 2017-08-08 DIAGNOSIS — E1122 Type 2 diabetes mellitus with diabetic chronic kidney disease: Secondary | ICD-10-CM | POA: Diagnosis not present

## 2017-08-26 ENCOUNTER — Encounter: Payer: Self-pay | Admitting: Nurse Practitioner

## 2017-08-26 ENCOUNTER — Ambulatory Visit (INDEPENDENT_AMBULATORY_CARE_PROVIDER_SITE_OTHER): Payer: Medicare Other | Admitting: Nurse Practitioner

## 2017-08-26 VITALS — BP 130/70 | HR 98 | Ht 69.0 in | Wt 291.1 lb

## 2017-08-26 DIAGNOSIS — I2589 Other forms of chronic ischemic heart disease: Secondary | ICD-10-CM

## 2017-08-26 DIAGNOSIS — I4819 Other persistent atrial fibrillation: Secondary | ICD-10-CM

## 2017-08-26 DIAGNOSIS — I481 Persistent atrial fibrillation: Secondary | ICD-10-CM | POA: Diagnosis not present

## 2017-08-26 DIAGNOSIS — I255 Ischemic cardiomyopathy: Secondary | ICD-10-CM

## 2017-08-26 DIAGNOSIS — I259 Chronic ischemic heart disease, unspecified: Secondary | ICD-10-CM

## 2017-08-26 DIAGNOSIS — Z7901 Long term (current) use of anticoagulants: Secondary | ICD-10-CM

## 2017-08-26 MED ORDER — CARVEDILOL 6.25 MG PO TABS: 6.2500 mg | ORAL_TABLET | Freq: Two times a day (BID) | ORAL | 3 refills | Status: DC

## 2017-08-26 NOTE — Patient Instructions (Addendum)
We will be checking the following labs today - NONE   Medication Instructions:    Continue with your current medicines.     Testing/Procedures To Be Arranged:  N/A  Follow-Up:   See me in about 4 to 6 months    Other Special Instructions:   N/A    If you need a refill on your cardiac medications before your next appointment, please call your pharmacy.   Call the [facility] office at [phone] if you have any questions, problems or concerns.      

## 2017-08-26 NOTE — Progress Notes (Signed)
CARDIOLOGY OFFICE NOTE  Date:  08/26/2017    Shane Kelly Date of Birth: 12-Feb-1951 Medical Record #[id number]  PCP:  Donald Prose, MD  Cardiologist:  Marisa Cyphers  Chief Complaint  Patient presents with  . Coronary Artery Disease  . Atrial Fibrillation  . Cardiomyopathy    6 month check - seen for Dr. Marlou Porch    History of Present Illness: Shane Kelly is a 67 y.o. male who presents today for a 6 month check. Seen for Dr. Marlou Porch.   He has a history of DM, CAD, persistent AF, chronic anticoagulation, and presumed ICM. He has had prior MI. He had prior coronary artery stent  2 in 2008 as well as stent 2 back in 2014 - all done in [place].  He has had issues with anxiety that have resulted in chest discomfort. Echo from 2016 shows normal EF. INR is followed by PCP.   Last seen by Dr. Marlou Porch in July - felt to be doing well.   Comes in today. Here alone. He says he is doing ok. No chest pain. His breathing is stable. Lots of depression - admits he is eating more - has considered drinking - apparently did this many years ago. His son died about 4 months ago - sounds like it was not a good situation. He admits he has pretty much given up - turns to food for comfort. Has a small group of men that he can talk with but no formal counseling it sounds like.    Past Medical History:  Diagnosis Date  . Diabetes ([place])   . Heart attack ([place])   . Hyperlipidemia   . Hypertension   . Irregular heart beat   . Kidney disease     Past Surgical History:  Procedure Laterality Date  . LEFT KNEE REPLACEMENT    . RIGHT HIP REPLACEMENT    . STENTS  2008/2014     Medications: Current Meds  Medication Sig  . allopurinol (ZYLOPRIM) 300 MG tablet Take 300 mg by mouth daily.  Marland Kitchen atorvastatin (LIPITOR) 80 MG tablet Take 80 mg by mouth daily.  Marland Kitchen buPROPion (WELLBUTRIN XL) 150 MG 24 hr tablet Take 150 mg by mouth daily.  . carvedilol (COREG) 6.25 MG tablet Take 6.25 mg by mouth  2 (two) times daily with a meal.  . diltiazem (CARDIZEM SR) 60 MG 12 hr capsule Take 60 mg by mouth 2 (two) times daily.  Marland Kitchen docusate sodium (COLACE) 100 MG capsule Take 100 mg by mouth daily as needed for mild constipation.  Marland Kitchen ezetimibe (ZETIA) 10 MG tablet Take 10 mg by mouth daily.  . fenofibrate 54 MG tablet Take 54 mg by mouth daily.  . furosemide (LASIX) 20 MG tablet Take 20 mg by mouth.  Marland Kitchen HYDROcodone-acetaminophen (NORCO/VICODIN) 5-325 MG per tablet Take 1 tablet by mouth every 6 (six) hours as needed for moderate pain.  Marland Kitchen insulin aspart protamine- aspart (NOVOLOG MIX 70/30) (70-30) 100 UNIT/ML injection Inject 30 Units into the skin 2 (two) times daily with a meal. 25 units AM and 20 units PM  . isosorbide mononitrate (IMDUR) 60 MG 24 hr tablet TAKE 1 TABLET BY MOUTH ONCE DAILY  . losartan (COZAAR) 100 MG tablet Take 100 mg by mouth daily.  . nitroGLYCERIN (NITROSTAT) 0.4 MG SL tablet Place 1 tablet (0.4 mg total) under the tongue every 5 (five) minutes as needed for chest pain.  Marland Kitchen spironolactone (ALDACTONE) 25 MG tablet Take 25 mg by mouth daily.  Marland Kitchen  warfarin (COUMADIN) 2.5 MG tablet Take 2.5 mg by mouth daily. TAKE 1 TABLET MON, WED, FRI AND A 1/2 TABLET TUES, THURS, SAT AND SUN     Allergies: No Known Allergies  Social History: The patient  reports that  has never smoked. he has never used smokeless tobacco. He reports that he drinks alcohol. He reports that he does not use drugs.   Family History: The patient's family history includes Diabetes in his father; Heart disease in his father and mother.   Review of Systems: Please see the history of present illness.   Otherwise, the review of systems is positive for none.   All other systems are reviewed and negative.   Physical Exam: VS:  BP 130/70 (BP Location: Left Arm, Patient Position: Sitting, Cuff Size: Large)   Pulse 98   Ht 5\' 9"  (1.753 m)   Wt 291 lb 1.9 oz (132.1 kg)   SpO2 99% Comment: at rest  BMI 42.99 kg/m  .   BMI Body mass index is 42.99 kg/m.  Wt Readings from Last 3 Encounters:  08/26/17 291 lb 1.9 oz (132.1 kg)  02/05/17 288 lb 12.8 oz (131 kg)  09/18/16 294 lb 1.9 oz (133.4 kg)    General: Quite tearful. He seems broken. Obese. He is in no acute distress.   HEENT: Normal.  Neck: Supple, no JVD, carotid bruits, or masses noted.  Cardiac: Irregular irregular rhythm. Rate is ok.  No murmurs, rubs, or gallops. No significant edema. Has lots of varicosities/venous insufficiency.   Respiratory:  Lungs are clear to auscultation bilaterally with normal work of breathing.  GI: Soft and nontender.  MS: No deformity or atrophy. Gait and ROM intact.  Skin: Warm and dry. Color is normal.  Neuro:  Strength and sensation are intact and no gross focal deficits noted.  Psych: Alert, appropriate and with normal affect.   LABORATORY DATA:  EKG:  EKG is not ordered today.   No results found for: WBC, HGB, HCT, PLT, GLUCOSE, CHOL, TRIG, HDL, LDLDIRECT, LDLCALC, ALT, AST, NA, K, CL, CREATININE, BUN, CO2, TSH, PSA, INR, GLUF, HGBA1C, MICROALBUR     BNP (last 3 results) No results for input(s): BNP in the last 8760 hours.  ProBNP (last 3 results) No results for input(s): PROBNP in the last 8760 hours.   Other Studies Reviewed Today:  Echo Study Conclusions 2016  - Left ventricle: The cavity size was normal. Wall thickness was   increased in a pattern of mild LVH. Systolic function was normal.   The estimated ejection fraction was in the range of 55% to 60%. - Left atrium: The atrium was moderately dilated. - Atrial septum: No defect or patent foramen ovale was identified.  Assessment/Plan:  1.  Atrial fibrillation - managed with rate control and anticoagulation - INR is managed by his PCP. Coreg refilled today.   2. Chronic anticoagulation - managed by his PCP - no problems noted.   3. Coronary artery disease - with prior stents - he has no active chest pain.   4. Morbid obesity -  remains the crux of his issues - he admits to stress eating.   5. Diabetes - followed by PCP  6. HLD - recent lipids noted.  7. Significant situational stress/grief - this is mostly what we talked about. He is quite broken hearted. He is not suicidal but admits he cannot focus on himself at this time. He is getting some counseling and we had a good talk today.  Current medicines are reviewed with the patient today.  The patient does not have concerns regarding medicines other than what has been noted above.  The following changes have been made:  See above.  Labs/ tests ordered today include:   No orders of the defined types were placed in this encounter.    Disposition:   FU with me in about 4 to 6 months.   Patient is agreeable to this plan and will call if any problems develop in the interim.   SignedTruitt Merle, NP  08/26/2017 8:44 AM  [facility] [address] [place], [place]  [address] Phone: [phone] Fax: [phone]

## 2017-09-04 DIAGNOSIS — I4891 Unspecified atrial fibrillation: Secondary | ICD-10-CM | POA: Diagnosis not present

## 2017-09-04 DIAGNOSIS — Z7901 Long term (current) use of anticoagulants: Secondary | ICD-10-CM | POA: Diagnosis not present

## 2017-09-11 ENCOUNTER — Ambulatory Visit (INDEPENDENT_AMBULATORY_CARE_PROVIDER_SITE_OTHER): Payer: Medicare Other | Admitting: Endocrinology

## 2017-09-11 ENCOUNTER — Encounter: Payer: Self-pay | Admitting: Endocrinology

## 2017-09-11 VITALS — BP 110/80 | HR 99 | Wt 292.4 lb

## 2017-09-11 DIAGNOSIS — I259 Chronic ischemic heart disease, unspecified: Secondary | ICD-10-CM | POA: Diagnosis not present

## 2017-09-11 DIAGNOSIS — N183 Chronic kidney disease, stage 3 unspecified: Secondary | ICD-10-CM

## 2017-09-11 DIAGNOSIS — Z794 Long term (current) use of insulin: Secondary | ICD-10-CM

## 2017-09-11 DIAGNOSIS — E1122 Type 2 diabetes mellitus with diabetic chronic kidney disease: Secondary | ICD-10-CM

## 2017-09-11 MED ORDER — INSULIN ASPART 100 UNIT/ML ~~LOC~~ SOLN: SUBCUTANEOUS | 99 refills | Status: DC

## 2017-09-11 MED ORDER — GLUCOSE BLOOD VI STRP: 1.0000 | ORAL_STRIP | Freq: Two times a day (BID) | 12 refills | Status: DC

## 2017-09-11 NOTE — Progress Notes (Signed)
Subjective:    Patient ID: Shane Kelly, male    DOB: 1950-11-10, 67 y.o.   MRN: [id number]  HPI pt is referred by Dr Nancy Fetter, for diabetes.  Pt states DM was dx'ed in 2015; he has mild if any neuropathy of the lower extremities; he has associated CAD and renal failure; he has been on insulin since soon after dx; pt says his diet and exercise are both improved recently; he has never had pancreatitis, pancreatic surgery, severe hypoglycemia or DKA.  He has mild hypoglycemia approx every 2 months, and it is mild.  He says cbg is highest fasting.   Past Medical History:  Diagnosis Date  . Diabetes ([place])   . Heart attack ([place])   . Hyperlipidemia   . Hypertension   . Irregular heart beat   . Kidney disease     Past Surgical History:  Procedure Laterality Date  . LEFT KNEE REPLACEMENT    . RIGHT HIP REPLACEMENT    . Erasmo Downer  2008/2014    Social History   Socioeconomic History  . Marital status: Married    Spouse name: Not on file  . Number of children: Not on file  . Years of education: Not on file  . Highest education level: Not on file  Social Needs  . Financial resource strain: Not on file  . Food insecurity - worry: Not on file  . Food insecurity - inability: Not on file  . Transportation needs - medical: Not on file  . Transportation needs - non-medical: Not on file  Occupational History  . Not on file  Tobacco Use  . Smoking status: Never Smoker  . Smokeless tobacco: Never Used  Substance and Sexual Activity  . Alcohol use: Yes    Alcohol/week: 0.0 oz  . Drug use: No  . Sexual activity: Not on file  Other Topics Concern  . Not on file  Social History Narrative  . Not on file    Current Outpatient Medications on File Prior to Visit  Medication Sig Dispense Refill  . allopurinol (ZYLOPRIM) 300 MG tablet Take 300 mg by mouth daily.    Marland Kitchen atorvastatin (LIPITOR) 80 MG tablet Take 80 mg by mouth daily.    Marland Kitchen buPROPion (WELLBUTRIN XL) 150 MG 24 hr tablet Take 150 mg  by mouth daily.    . carvedilol (COREG) 6.25 MG tablet Take 1 tablet (6.25 mg total) by mouth 2 (two) times daily with a meal. 180 tablet 3  . diltiazem (CARDIZEM SR) 60 MG 12 hr capsule Take 60 mg by mouth 2 (two) times daily.    Marland Kitchen docusate sodium (COLACE) 100 MG capsule Take 100 mg by mouth daily as needed for mild constipation.    Marland Kitchen ezetimibe (ZETIA) 10 MG tablet Take 10 mg by mouth daily.    . fenofibrate 54 MG tablet Take 54 mg by mouth daily.    . furosemide (LASIX) 20 MG tablet Take 20 mg by mouth.    Marland Kitchen HYDROcodone-acetaminophen (NORCO/VICODIN) 5-325 MG per tablet Take 1 tablet by mouth every 6 (six) hours as needed for moderate pain.    . isosorbide mononitrate (IMDUR) 60 MG 24 hr tablet TAKE 1 TABLET BY MOUTH ONCE DAILY 90 tablet 2  . losartan (COZAAR) 100 MG tablet Take 100 mg by mouth daily.    . nitroGLYCERIN (NITROSTAT) 0.4 MG SL tablet Place 1 tablet (0.4 mg total) under the tongue every 5 (five) minutes as needed for chest pain. 25 tablet prn  .  spironolactone (ALDACTONE) 25 MG tablet Take 25 mg by mouth daily.    Marland Kitchen warfarin (COUMADIN) 2.5 MG tablet Take 2.5 mg by mouth daily. TAKE 1 TABLET MON, WED, FRI AND A 1/2 TABLET TUES, THURS, SAT AND SUN     No current facility-administered medications on file prior to visit.     No Known Allergies  Family History  Problem Relation Age of Onset  . Diabetes Father   . Heart disease Father   . Heart disease Mother   . Cancer Neg Hx     BP 110/80 (BP Location: Left Arm, Patient Position: Sitting, Cuff Size: Large)   Pulse 99   Wt 292 lb 6.4 oz (132.6 kg)   BMI 43.18 kg/m   Review of Systems denies blurry vision, headache, chest pain, sob, n/v, urinary frequency, muscle cramps, excessive diaphoresis, memory loss, cold intolerance, rhinorrhea, and easy bruising.  He has weight gain, easy bruising, and depression     Objective:   Physical Exam VS: see vs page GEN: no distress.  Morbid obesity.  HEAD: head: no deformity eyes:  no periorbital swelling, no proptosis external nose and ears are normal mouth: no lesion seen NECK: supple, thyroid is not enlarged CHEST WALL: no deformity LUNGS: clear to auscultation CV: reg rate but irreg rhythm, no murmur ABD: abdomen is soft, nontender.  no hepatosplenomegaly.  not distended.  no hernia MUSCULOSKELETAL: muscle bulk and strength are grossly normal.  no obvious joint swelling.  gait is normal and steady EXTEMITIES: no deformity.  no ulcer on the feet.  feet are of normal temp, but there is hyperpigmentation of the legs.  1+ bilat leg edema.  There is bilateral onychomycosis of the toenails PULSES: dorsalis pedis intact bilat.  no carotid bruit NEURO:  cn 2-12 grossly intact.   readily moves all 4's.  sensation is intact to touch on the feet SKIN:  Normal texture and temperature.  No rash or suspicious lesion is visible.   NODES:  None palpable at the neck PSYCH: alert, well-oriented.  Does not appear anxious nor depressed.   outside test results are reviewed: A1c=7.7%  I have reviewed outside records, and summarized: Pt was noted to have elevated a1c, and referred here.  Depression was noted to be affecting his ability to care for his DM     Assessment & Plan:  Insulin-requiring type 2 DM: he needs increased rx, if it can be done with a regimen that avoids or minimizes hypoglycemia.   Renal insuff: he does not need basal insulin.   Patient Instructions  good diet and exercise significantly improve the control of your diabetes.  please let me know if you wish to be referred to a dietician.  high blood sugar is very risky to your health.  you should see an eye doctor and dentist every year.  It is very important to get all recommended vaccinations.  Controlling your blood pressure and cholesterol drastically reduces the damage diabetes does to your body.  Those who smoke should quit.  Please discuss these with your doctor.  check your blood sugar twice a day.  vary  the time of day when you check, between before the 3 meals, and at bedtime.  also check if you have symptoms of your blood sugar being too high or too low.  please keep a record of the readings and bring it to your next appointment here (or you can bring the meter itself).  You can write it on any piece of  paper.  please call us sooner if your blood sugar goes below 70, or if you have a lot of readings over 200. I have sent a prescription to your pharmacy, to change to novolog, 3 times a day (just before each meal) 20-10-20 units. Please call or message [place] next week, to tell us how the blood sugar is doing.  Please come back for a follow-up appointment in 1 month.

## 2017-09-11 NOTE — Patient Instructions (Addendum)
good diet and exercise significantly improve the control of your diabetes.  please let me know if you wish to be referred to a dietician.  high blood sugar is very risky to your health.  you should see an eye doctor and dentist every year.  It is very important to get all recommended vaccinations.  Controlling your blood pressure and cholesterol drastically reduces the damage diabetes does to your body.  Those who smoke should quit.  Please discuss these with your doctor.  check your blood sugar twice a day.  vary the time of day when you check, between before the 3 meals, and at bedtime.  also check if you have symptoms of your blood sugar being too high or too low.  please keep a record of the readings and bring it to your next appointment here (or you can bring the meter itself).  You can write it on any piece of paper.  please call us sooner if your blood sugar goes below 70, or if you have a lot of readings over 200. I have sent a prescription to your pharmacy, to change to novolog, 3 times a day (just before each meal) 20-10-20 units. Please call or message [place] next week, to tell us how the blood sugar is doing.  Please come back for a follow-up appointment in 1 month.

## 2017-09-13 ENCOUNTER — Other Ambulatory Visit: Payer: Self-pay | Admitting: *Deleted

## 2017-09-13 MED ORDER — GLUCOSE BLOOD VI STRP: 1.0000 | ORAL_STRIP | Freq: Two times a day (BID) | 12 refills | Status: DC

## 2017-09-13 MED ORDER — ACCU-CHEK SOFTCLIX LANCETS MISC: 12 refills | Status: AC

## 2017-09-19 ENCOUNTER — Telehealth: Payer: Self-pay | Admitting: Endocrinology

## 2017-09-19 NOTE — Telephone Encounter (Signed)
Patient is calling to give someone the Glucose levels for the last few days to see how they were running    Please advise  Thanks!

## 2017-09-20 NOTE — Telephone Encounter (Signed)
Pt informed

## 2017-09-20 NOTE — Telephone Encounter (Signed)
Looks good. Please continue the same insulin. I'll see you next time.

## 2017-09-20 NOTE — Telephone Encounter (Signed)
I called pt- He's reporting his CBGS and just wants to know if MD suggests any changes. He denies any sxs and states he feels good.    Sunday   119 AM    77 PM Monday   97 AM      168 PM Tues       110 AM     120 PM Wed        99 AM      128 PM Thur        118 AM     11 2PM

## 2017-10-02 DIAGNOSIS — Z7901 Long term (current) use of anticoagulants: Secondary | ICD-10-CM | POA: Diagnosis not present

## 2017-10-02 DIAGNOSIS — I4891 Unspecified atrial fibrillation: Secondary | ICD-10-CM | POA: Diagnosis not present

## 2017-10-09 ENCOUNTER — Ambulatory Visit (INDEPENDENT_AMBULATORY_CARE_PROVIDER_SITE_OTHER): Payer: Medicare Other | Admitting: Endocrinology

## 2017-10-09 ENCOUNTER — Encounter: Payer: Self-pay | Admitting: Endocrinology

## 2017-10-09 VITALS — BP 136/84 | HR 90 | Ht 69.0 in | Wt 296.0 lb

## 2017-10-09 DIAGNOSIS — I129 Hypertensive chronic kidney disease with stage 1 through stage 4 chronic kidney disease, or unspecified chronic kidney disease: Secondary | ICD-10-CM | POA: Diagnosis not present

## 2017-10-09 DIAGNOSIS — Z794 Long term (current) use of insulin: Secondary | ICD-10-CM

## 2017-10-09 DIAGNOSIS — N183 Chronic kidney disease, stage 3 unspecified: Secondary | ICD-10-CM

## 2017-10-09 DIAGNOSIS — I259 Chronic ischemic heart disease, unspecified: Secondary | ICD-10-CM | POA: Diagnosis not present

## 2017-10-09 DIAGNOSIS — E1122 Type 2 diabetes mellitus with diabetic chronic kidney disease: Secondary | ICD-10-CM

## 2017-10-09 DIAGNOSIS — I4891 Unspecified atrial fibrillation: Secondary | ICD-10-CM | POA: Diagnosis not present

## 2017-10-09 LAB — HEMOGLOBIN A1C: Hgb A1c MFr Bld: 7.1 % — ABNORMAL HIGH (ref 4.6–6.5)

## 2017-10-09 NOTE — Patient Instructions (Addendum)
check your blood sugar twice a day.  vary the time of day when you check, between before the 3 meals, and at bedtime.  also check if you have symptoms of your blood sugar being too high or too low.  please keep a record of the readings and bring it to your next appointment here (or you can bring the meter itself).  You can write it on any piece of paper.  please call us sooner if your blood sugar goes below 70, or if you have a lot of readings over 200. blood tests are requested for you today.  We'll let you know about the results.  Please come back for a follow-up appointment in 4 months.

## 2017-10-09 NOTE — Progress Notes (Signed)
Subjective:    Patient ID: Shane Kelly, male    DOB: 08-May-1951, 67 y.o.   MRN: [id number]  HPI Pt returns for f/u of diabetes mellitus: DM type: Insulin-requiring type 2.   Dx'ed: 4098 Complications: polyneuropathy, CAD, and renal failure Therapy: insulin since soon after dx DKA: never Severe hypoglycemia: never Pancreatitis: never Pancreatic imaging:  Other: he takes multiple daily injections; due to renal failure, he does not need basal insulin Interval history: He brings a record of his cbg's which I have reviewed today.  It varies from 110-144.  There is no trend throughout the day.  He says he seldom misses the insulin.  Past Medical History:  Diagnosis Date  . Diabetes ([place])   . Heart attack ([place])   . Hyperlipidemia   . Hypertension   . Irregular heart beat   . Kidney disease     Past Surgical History:  Procedure Laterality Date  . LEFT KNEE REPLACEMENT    . RIGHT HIP REPLACEMENT    . Erasmo Downer  2008/2014    Social History   Socioeconomic History  . Marital status: Married    Spouse name: Not on file  . Number of children: Not on file  . Years of education: Not on file  . Highest education level: Not on file  Occupational History  . Not on file  Social Needs  . Financial resource strain: Not on file  . Food insecurity:    Worry: Not on file    Inability: Not on file  . Transportation needs:    Medical: Not on file    Non-medical: Not on file  Tobacco Use  . Smoking status: Never Smoker  . Smokeless tobacco: Never Used  Substance and Sexual Activity  . Alcohol use: Yes    Alcohol/week: 0.0 oz  . Drug use: No  . Sexual activity: Not on file  Lifestyle  . Physical activity:    Days per week: Not on file    Minutes per session: Not on file  . Stress: Not on file  Relationships  . Social connections:    Talks on phone: Not on file    Gets together: Not on file    Attends religious service: Not on file    Active member of club or  organization: Not on file    Attends meetings of clubs or organizations: Not on file    Relationship status: Not on file  . Intimate partner violence:    Fear of current or ex partner: Not on file    Emotionally abused: Not on file    Physically abused: Not on file    Forced sexual activity: Not on file  Other Topics Concern  . Not on file  Social History Narrative  . Not on file    Current Outpatient Medications on File Prior to Visit  Medication Sig Dispense Refill  . ACCU-CHEK SOFTCLIX LANCETS lancets Use as instructed to check blood sugar twice daily 100 each 12  . allopurinol (ZYLOPRIM) 300 MG tablet Take 300 mg by mouth daily.    Marland Kitchen atorvastatin (LIPITOR) 80 MG tablet Take 80 mg by mouth daily.    Marland Kitchen buPROPion (WELLBUTRIN XL) 150 MG 24 hr tablet Take 150 mg by mouth daily.    . carvedilol (COREG) 6.25 MG tablet Take 1 tablet (6.25 mg total) by mouth 2 (two) times daily with a meal. 180 tablet 3  . diltiazem (CARDIZEM SR) 60 MG 12 hr capsule Take 60 mg by mouth 2 (  two) times daily.    Marland Kitchen docusate sodium (COLACE) 100 MG capsule Take 100 mg by mouth daily as needed for mild constipation.    Marland Kitchen ezetimibe (ZETIA) 10 MG tablet Take 10 mg by mouth daily.    . fenofibrate 54 MG tablet Take 54 mg by mouth daily.    . furosemide (LASIX) 20 MG tablet Take 20 mg by mouth.    Marland Kitchen glucose blood (ACCU-CHEK AVIVA) test strip 1 each by Other route 2 (two) times daily. And lancets 2/day 100 each 12  . HYDROcodone-acetaminophen (NORCO/VICODIN) 5-325 MG per tablet Take 1 tablet by mouth every 6 (six) hours as needed for moderate pain.    . isosorbide mononitrate (IMDUR) 60 MG 24 hr tablet TAKE 1 TABLET BY MOUTH ONCE DAILY 90 tablet 2  . losartan (COZAAR) 100 MG tablet Take 100 mg by mouth daily.    . nitroGLYCERIN (NITROSTAT) 0.4 MG SL tablet Place 1 tablet (0.4 mg total) under the tongue every 5 (five) minutes as needed for chest pain. 25 tablet prn  . spironolactone (ALDACTONE) 25 MG tablet Take 25 mg  by mouth daily.    Marland Kitchen warfarin (COUMADIN) 2.5 MG tablet Take 2.5 mg by mouth daily. TAKE 1 TABLET MON, WED, FRI AND A 1/2 TABLET TUES, THURS, SAT AND SUN     No current facility-administered medications on file prior to visit.     No Known Allergies  Family History  Problem Relation Age of Onset  . Diabetes Father   . Heart disease Father   . Heart disease Mother   . Cancer Neg Hx     BP 136/84   Pulse 90   Ht 5\' 9"  (1.753 m)   Wt 296 lb (134.3 kg)   SpO2 96%   BMI 43.71 kg/m   Review of Systems He denies hypoglycemia.      Objective:   Physical Exam VITAL SIGNS:  See vs page GENERAL: no distress SKIN: no ulcer on the feet. CV: trace bilat leg edema. EXTEMITIES: no deformity.  feet are of normal temp, but there is hyperpigmentation of the legs.   There is bilateral onychomycosis of the toenails PULSES: dorsalis pedis intact bilat.   NEURO:  sensation is intact to touch on the feet, but decreased from normal.   Lab Results  Component Value Date   HGBA1C 7.1 (H) 10/09/2017      Assessment & Plan:  Insulin-requiring type 2 DM, with renal failure: he needs increased rx.  Patient Instructions  check your blood sugar twice a day.  vary the time of day when you check, between before the 3 meals, and at bedtime.  also check if you have symptoms of your blood sugar being too high or too low.  please keep a record of the readings and bring it to your next appointment here (or you can bring the meter itself).  You can write it on any piece of paper.  please call us sooner if your blood sugar goes below 70, or if you have a lot of readings over 200. blood tests are requested for you today.  We'll let you know about the results.  Please come back for a follow-up appointment in 4 months.

## 2017-10-10 LAB — BASIC METABOLIC PANEL
BUN: 34 mg/dL — ABNORMAL HIGH (ref 6–23)
CO2: 23 mEq/L (ref 19–32)
Calcium: 10.3 mg/dL (ref 8.4–10.5)
Chloride: 109 mEq/L (ref 96–112)
Creatinine, Ser: 1.88 mg/dL — ABNORMAL HIGH (ref 0.40–1.50)
GFR: 38.25 mL/min — ABNORMAL LOW (ref >60.00)
Glucose, Bld: 144 mg/dL — ABNORMAL HIGH (ref 70–99)
Potassium: 5.2 mEq/L — ABNORMAL HIGH (ref 3.5–5.1)
Sodium: 140 mEq/L (ref 135–145)

## 2017-10-10 MED ORDER — INSULIN ASPART 100 UNIT/ML ~~LOC~~ SOLN: SUBCUTANEOUS | 99 refills | Status: DC

## 2017-10-11 ENCOUNTER — Other Ambulatory Visit: Payer: Self-pay

## 2017-10-11 LAB — FRUCTOSAMINE: Fructosamine: 259 umol/L (ref 190–270)

## 2017-10-11 MED ORDER — INSULIN ASPART 100 UNIT/ML ~~LOC~~ SOLN: SUBCUTANEOUS | 6 refills | Status: DC

## 2017-10-30 DIAGNOSIS — I4891 Unspecified atrial fibrillation: Secondary | ICD-10-CM | POA: Diagnosis not present

## 2017-10-30 DIAGNOSIS — Z7901 Long term (current) use of anticoagulants: Secondary | ICD-10-CM | POA: Diagnosis not present

## 2017-12-03 DIAGNOSIS — Z7901 Long term (current) use of anticoagulants: Secondary | ICD-10-CM | POA: Diagnosis not present

## 2017-12-03 DIAGNOSIS — I4891 Unspecified atrial fibrillation: Secondary | ICD-10-CM | POA: Diagnosis not present

## 2017-12-09 ENCOUNTER — Other Ambulatory Visit: Payer: Self-pay | Admitting: Cardiology

## 2017-12-10 DIAGNOSIS — Z7901 Long term (current) use of anticoagulants: Secondary | ICD-10-CM | POA: Diagnosis not present

## 2017-12-10 DIAGNOSIS — J209 Acute bronchitis, unspecified: Secondary | ICD-10-CM | POA: Diagnosis not present

## 2017-12-10 DIAGNOSIS — I10 Essential (primary) hypertension: Secondary | ICD-10-CM | POA: Diagnosis not present

## 2017-12-19 DIAGNOSIS — I4891 Unspecified atrial fibrillation: Secondary | ICD-10-CM | POA: Diagnosis not present

## 2017-12-19 DIAGNOSIS — Z7901 Long term (current) use of anticoagulants: Secondary | ICD-10-CM | POA: Diagnosis not present

## 2017-12-31 DIAGNOSIS — I4891 Unspecified atrial fibrillation: Secondary | ICD-10-CM | POA: Diagnosis not present

## 2017-12-31 DIAGNOSIS — Z7901 Long term (current) use of anticoagulants: Secondary | ICD-10-CM | POA: Diagnosis not present

## 2018-01-07 DIAGNOSIS — I4891 Unspecified atrial fibrillation: Secondary | ICD-10-CM | POA: Diagnosis not present

## 2018-01-07 DIAGNOSIS — Z7901 Long term (current) use of anticoagulants: Secondary | ICD-10-CM | POA: Diagnosis not present

## 2018-01-22 DIAGNOSIS — I4891 Unspecified atrial fibrillation: Secondary | ICD-10-CM | POA: Diagnosis not present

## 2018-01-22 DIAGNOSIS — Z7901 Long term (current) use of anticoagulants: Secondary | ICD-10-CM | POA: Diagnosis not present

## 2018-01-28 DIAGNOSIS — I2510 Atherosclerotic heart disease of native coronary artery without angina pectoris: Secondary | ICD-10-CM | POA: Diagnosis not present

## 2018-01-28 DIAGNOSIS — E782 Mixed hyperlipidemia: Secondary | ICD-10-CM | POA: Diagnosis not present

## 2018-01-28 DIAGNOSIS — Z0000 Encounter for general adult medical examination without abnormal findings: Secondary | ICD-10-CM | POA: Diagnosis not present

## 2018-01-28 DIAGNOSIS — Z125 Encounter for screening for malignant neoplasm of prostate: Secondary | ICD-10-CM | POA: Diagnosis not present

## 2018-01-28 DIAGNOSIS — I4891 Unspecified atrial fibrillation: Secondary | ICD-10-CM | POA: Diagnosis not present

## 2018-01-28 DIAGNOSIS — Z1159 Encounter for screening for other viral diseases: Secondary | ICD-10-CM | POA: Diagnosis not present

## 2018-01-28 DIAGNOSIS — I10 Essential (primary) hypertension: Secondary | ICD-10-CM | POA: Diagnosis not present

## 2018-01-28 DIAGNOSIS — N184 Chronic kidney disease, stage 4 (severe): Secondary | ICD-10-CM | POA: Diagnosis not present

## 2018-01-28 DIAGNOSIS — E1122 Type 2 diabetes mellitus with diabetic chronic kidney disease: Secondary | ICD-10-CM | POA: Diagnosis not present

## 2018-01-28 DIAGNOSIS — I509 Heart failure, unspecified: Secondary | ICD-10-CM | POA: Diagnosis not present

## 2018-01-28 DIAGNOSIS — Z6841 Body Mass Index (BMI) 40.0 and over, adult: Secondary | ICD-10-CM | POA: Diagnosis not present

## 2018-01-28 DIAGNOSIS — M5431 Sciatica, right side: Secondary | ICD-10-CM | POA: Diagnosis not present

## 2018-02-04 ENCOUNTER — Ambulatory Visit: Payer: Medicare Other | Admitting: Nurse Practitioner

## 2018-02-07 ENCOUNTER — Encounter: Payer: Self-pay | Admitting: Endocrinology

## 2018-02-07 ENCOUNTER — Ambulatory Visit (INDEPENDENT_AMBULATORY_CARE_PROVIDER_SITE_OTHER): Payer: Medicare Other | Admitting: Endocrinology

## 2018-02-07 VITALS — BP 130/78 | HR 76 | Wt 291.6 lb

## 2018-02-07 DIAGNOSIS — E1122 Type 2 diabetes mellitus with diabetic chronic kidney disease: Secondary | ICD-10-CM | POA: Diagnosis not present

## 2018-02-07 DIAGNOSIS — I259 Chronic ischemic heart disease, unspecified: Secondary | ICD-10-CM | POA: Diagnosis not present

## 2018-02-07 DIAGNOSIS — Z794 Long term (current) use of insulin: Secondary | ICD-10-CM | POA: Diagnosis not present

## 2018-02-07 DIAGNOSIS — N183 Chronic kidney disease, stage 3 unspecified: Secondary | ICD-10-CM

## 2018-02-07 LAB — POCT GLYCOSYLATED HEMOGLOBIN (HGB A1C): Hemoglobin A1C: 6.6 % — AB (ref 4.0–5.6)

## 2018-02-07 MED ORDER — INSULIN ASPART 100 UNIT/ML ~~LOC~~ SOLN: SUBCUTANEOUS | 6 refills | Status: DC

## 2018-02-07 NOTE — Progress Notes (Signed)
Subjective:    Patient ID: Shane Kelly, male    DOB: [birth year], 67 y.o.   MRN: [id number]  HPI Pt returns for f/u of diabetes mellitus: DM type: Insulin-requiring type 2.   Dx'ed: 0814 Complications: polyneuropathy, CAD, and renal failure Therapy: insulin since soon after dx DKA: never Severe hypoglycemia: never Pancreatitis: never Pancreatic imaging:  Other: he takes multiple daily injections; due to renal failure, he does not need basal insulin; fructosamine has shown similar glycemic control to A1c Interval history: no cbg record, but states cbg's are in the mid-100's  There is no trend throughout the day.  He says he seldom misses the insulin.  He seldom has hypoglycemia, and these episodes are mild.   Past Medical History:  Diagnosis Date  . Diabetes ([place])   . Heart attack ([place])   . Hyperlipidemia   . Hypertension   . Irregular heart beat   . Kidney disease     Past Surgical History:  Procedure Laterality Date  . LEFT KNEE REPLACEMENT    . RIGHT HIP REPLACEMENT    . Erasmo Downer  2008/2014    Social History   Socioeconomic History  . Marital status: Married    Spouse name: Not on file  . Number of children: Not on file  . Years of education: Not on file  . Highest education level: Not on file  Occupational History  . Not on file  Social Needs  . Financial resource strain: Not on file  . Food insecurity:    Worry: Not on file    Inability: Not on file  . Transportation needs:    Medical: Not on file    Non-medical: Not on file  Tobacco Use  . Smoking status: Never Smoker  . Smokeless tobacco: Never Used  Substance and Sexual Activity  . Alcohol use: Yes    Alcohol/week: 0.0 oz  . Drug use: No  . Sexual activity: Not on file  Lifestyle  . Physical activity:    Days per week: Not on file    Minutes per session: Not on file  . Stress: Not on file  Relationships  . Social connections:    Talks on phone: Not on file    Gets together: Not on file    Attends religious service: Not on file    Active member of club or organization: Not on file    Attends meetings of clubs or organizations: Not on file    Relationship status: Not on file  . Intimate partner violence:    Fear of current or ex partner: Not on file    Emotionally abused: Not on file    Physically abused: Not on file    Forced sexual activity: Not on file  Other Topics Concern  . Not on file  Social History Narrative  . Not on file    Current Outpatient Medications on File Prior to Visit  Medication Sig Dispense Refill  . ACCU-CHEK SOFTCLIX LANCETS lancets Use as instructed to check blood sugar twice daily 100 each 12  . allopurinol (ZYLOPRIM) 300 MG tablet Take 300 mg by mouth daily.    Marland Kitchen atorvastatin (LIPITOR) 80 MG tablet Take 80 mg by mouth daily.    Marland Kitchen buPROPion (WELLBUTRIN XL) 150 MG 24 hr tablet Take 150 mg by mouth daily.    . carvedilol (COREG) 6.25 MG tablet Take 1 tablet (6.25 mg total) by mouth 2 (two) times daily with a meal. 180 tablet 3  . diltiazem (CARDIZEM SR)  60 MG 12 hr capsule Take 60 mg by mouth 2 (two) times daily.    Marland Kitchen docusate sodium (COLACE) 100 MG capsule Take 100 mg by mouth daily as needed for mild constipation.    Marland Kitchen ezetimibe (ZETIA) 10 MG tablet Take 10 mg by mouth daily.    . fenofibrate 54 MG tablet Take 54 mg by mouth daily.    . furosemide (LASIX) 20 MG tablet Take 20 mg by mouth.    Marland Kitchen glucose blood (ACCU-CHEK AVIVA) test strip 1 each by Other route 2 (two) times daily. And lancets 2/day 100 each 12  . HYDROcodone-acetaminophen (NORCO/VICODIN) 5-325 MG per tablet Take 1 tablet by mouth every 6 (six) hours as needed for moderate pain.    . isosorbide mononitrate (IMDUR) 60 MG 24 hr tablet TAKE 1 TABLET BY MOUTH ONCE DAILY 90 tablet 2  . losartan (COZAAR) 100 MG tablet Take 100 mg by mouth daily.    . nitroGLYCERIN (NITROSTAT) 0.4 MG SL tablet Place 1 tablet (0.4 mg total) under the tongue every 5 (five) minutes as needed for chest pain.  25 tablet prn  . spironolactone (ALDACTONE) 25 MG tablet Take 25 mg by mouth daily.    Marland Kitchen warfarin (COUMADIN) 2.5 MG tablet Take 2.5 mg by mouth daily. TAKE 1 TABLET MON, WED, FRI AND A 1/2 TABLET TUES, THURS, SAT AND SUN     No current facility-administered medications on file prior to visit.     No Known Allergies  Family History  Problem Relation Age of Onset  . Diabetes Father   . Heart disease Father   . Heart disease Mother   . Cancer Neg Hx     BP 130/78 (BP Location: Left Arm, Patient Position: Sitting, Cuff Size: Normal)   Pulse 76   Wt 291 lb 9.6 oz (132.3 kg)   SpO2 94%   BMI 43.06 kg/m    Review of Systems Denies LOC.  He has gained weight    Objective:   Physical Exam VITAL SIGNS:  See vs page GENERAL: no distress SKIN: no ulcer on the feet. CV: trace bilat leg edema, and bilat large vv's EXTEMITIES: no deformity.  feet are of normal temp, but there is severe hyperpigmentation of the legs.   There is bilateral onychomycosis of the toenails PULSES: dorsalis pedis intact bilat.   NEURO:  sensation is intact to touch on the feet, but decreased from normal.  Lab Results  Component Value Date   HGBA1C 6.6 (A) 02/07/2018        Assessment & Plan:  Insulin-requiring type 2 DM, with CAD.  well-controlled Renal failure: he does not needs basal insulin. Obesity, worse: he requests to reduce insulin.   Patient Instructions  check your blood sugar twice a day.  vary the time of day when you check, between before the 3 meals, and at bedtime.  also check if you have symptoms of your blood sugar being too high or too low.  please keep a record of the readings and bring it to your next appointment here (or you can bring the meter itself).  You can write it on any piece of paper.  please call us sooner if your blood sugar goes below 70, or if you have a lot of readings over 200. Please reduce the insulin to the numbers listed below Please come back for a follow-up  appointment in 4 months.

## 2018-02-07 NOTE — Patient Instructions (Addendum)
check your blood sugar twice a day.  vary the time of day when you check, between before the 3 meals, and at bedtime.  also check if you have symptoms of your blood sugar being too high or too low.  please keep a record of the readings and bring it to your next appointment here (or you can bring the meter itself).  You can write it on any piece of paper.  please call us sooner if your blood sugar goes below 70, or if you have a lot of readings over 200. Please reduce the insulin to the numbers listed below Please come back for a follow-up appointment in 4 months.

## 2018-02-11 DIAGNOSIS — I4891 Unspecified atrial fibrillation: Secondary | ICD-10-CM | POA: Diagnosis not present

## 2018-02-11 DIAGNOSIS — Z7901 Long term (current) use of anticoagulants: Secondary | ICD-10-CM | POA: Diagnosis not present

## 2018-02-26 DIAGNOSIS — Z794 Long term (current) use of insulin: Secondary | ICD-10-CM | POA: Diagnosis not present

## 2018-02-26 DIAGNOSIS — Z79899 Other long term (current) drug therapy: Secondary | ICD-10-CM | POA: Diagnosis not present

## 2018-02-26 DIAGNOSIS — G8911 Acute pain due to trauma: Secondary | ICD-10-CM | POA: Diagnosis not present

## 2018-02-26 DIAGNOSIS — S81811A Laceration without foreign body, right lower leg, initial encounter: Secondary | ICD-10-CM | POA: Diagnosis not present

## 2018-02-26 DIAGNOSIS — Z7901 Long term (current) use of anticoagulants: Secondary | ICD-10-CM | POA: Diagnosis not present

## 2018-02-26 DIAGNOSIS — I4891 Unspecified atrial fibrillation: Secondary | ICD-10-CM | POA: Diagnosis not present

## 2018-02-26 DIAGNOSIS — I2510 Atherosclerotic heart disease of native coronary artery without angina pectoris: Secondary | ICD-10-CM | POA: Diagnosis not present

## 2018-02-26 DIAGNOSIS — S81812A Laceration without foreign body, left lower leg, initial encounter: Secondary | ICD-10-CM | POA: Diagnosis not present

## 2018-02-26 DIAGNOSIS — E119 Type 2 diabetes mellitus without complications: Secondary | ICD-10-CM | POA: Diagnosis not present

## 2018-02-26 DIAGNOSIS — I10 Essential (primary) hypertension: Secondary | ICD-10-CM | POA: Diagnosis not present

## 2018-03-07 DIAGNOSIS — Z4802 Encounter for removal of sutures: Secondary | ICD-10-CM | POA: Diagnosis not present

## 2018-03-07 DIAGNOSIS — S81812D Laceration without foreign body, left lower leg, subsequent encounter: Secondary | ICD-10-CM | POA: Diagnosis not present

## 2018-03-11 DIAGNOSIS — Z7901 Long term (current) use of anticoagulants: Secondary | ICD-10-CM | POA: Diagnosis not present

## 2018-03-11 DIAGNOSIS — I4891 Unspecified atrial fibrillation: Secondary | ICD-10-CM | POA: Diagnosis not present

## 2018-03-12 DIAGNOSIS — S81812D Laceration without foreign body, left lower leg, subsequent encounter: Secondary | ICD-10-CM | POA: Diagnosis not present

## 2018-03-12 DIAGNOSIS — Z4802 Encounter for removal of sutures: Secondary | ICD-10-CM | POA: Diagnosis not present

## 2018-03-14 DIAGNOSIS — Z1211 Encounter for screening for malignant neoplasm of colon: Secondary | ICD-10-CM | POA: Diagnosis not present

## 2018-03-14 DIAGNOSIS — Z7901 Long term (current) use of anticoagulants: Secondary | ICD-10-CM | POA: Diagnosis not present

## 2018-03-17 ENCOUNTER — Encounter: Payer: Self-pay | Admitting: Nurse Practitioner

## 2018-03-17 ENCOUNTER — Encounter (INDEPENDENT_AMBULATORY_CARE_PROVIDER_SITE_OTHER): Payer: Self-pay

## 2018-03-17 ENCOUNTER — Ambulatory Visit (INDEPENDENT_AMBULATORY_CARE_PROVIDER_SITE_OTHER): Payer: Medicare Other | Admitting: Nurse Practitioner

## 2018-03-17 VITALS — BP 120/68 | HR 67 | Ht 70.0 in | Wt 291.8 lb

## 2018-03-17 DIAGNOSIS — I4819 Other persistent atrial fibrillation: Secondary | ICD-10-CM

## 2018-03-17 DIAGNOSIS — I481 Persistent atrial fibrillation: Secondary | ICD-10-CM | POA: Diagnosis not present

## 2018-03-17 DIAGNOSIS — I259 Chronic ischemic heart disease, unspecified: Secondary | ICD-10-CM

## 2018-03-17 DIAGNOSIS — Z7901 Long term (current) use of anticoagulants: Secondary | ICD-10-CM | POA: Diagnosis not present

## 2018-03-17 LAB — BASIC METABOLIC PANEL
BUN/Creatinine Ratio: 16 (ref 10–24)
BUN: 45 mg/dL — ABNORMAL HIGH (ref 8–27)
CO2: 24 mmol/L (ref 20–29)
Calcium: 10 mg/dL (ref 8.6–10.2)
Chloride: 100 mmol/L (ref 96–106)
Creatinine, Ser: 2.76 mg/dL — ABNORMAL HIGH (ref 0.76–1.27)
GFR calc Af Amer: 26 mL/min/{1.73_m2} — ABNORMAL LOW (ref >59)
GFR calc non Af Amer: 23 mL/min/{1.73_m2} — ABNORMAL LOW (ref >59)
Glucose: 164 mg/dL — ABNORMAL HIGH (ref 65–99)
Potassium: 4.8 mmol/L (ref 3.5–5.2)
Sodium: 139 mmol/L (ref 134–144)

## 2018-03-17 MED ORDER — NITROGLYCERIN 0.4 MG SL SUBL: 0.4000 mg | SUBLINGUAL_TABLET | SUBLINGUAL | 99 refills | Status: DC | PRN

## 2018-03-17 NOTE — Progress Notes (Signed)
CARDIOLOGY OFFICE NOTE  Date:  03/17/2018    Shane Kelly Date of Birth: [birth year] Medical Record #[id number]  PCP:  Shane Huxley, PA  Cardiologist:  Shane Kelly   Chief Complaint  Patient presents with  . Coronary Artery Disease  . Atrial Fibrillation    6 month check - seen for Dr. Marlou Kelly    History of Present Illness: Shane Kelly is a 67 y.o. male who presents today for a 6 month check. Seen for Dr. Marlou Kelly.   He has a history of DM, CAD, persistent AF, chronic anticoagulation, and presumed ICM. He has had prior MI. He had prior coronary artery stent  2 in 2008 as well as stent 2 back in 2014 - all done in [place].  He has had issues with anxiety that have resulted in chest discomfort. Echo from 2016 shows normal EF.INR is followed by PCP.  Last seen by Dr. Marlou Kelly in July of 2018 - felt to be doing well.   I last saw him in February - pretty depressed due to his son's death - using food as comfort.   Comes in today. Here alone. He says he is doing much better. Has had more loss - his cat died. Two of his friends from his men's group have died - says he can't do "anymore funerals". No chest pain. Breathing is good. He has an injury to his lower left leg - on antibiotics. For colonoscopy later this month. PCP monitors his coumadin. He can go up a flight of stairs. He can walk around the block. He has started back making knifes. He is very appreciative of our conversation from earlier this year. Admits he is not nearly depressed. He is working on his weight - got up to 319# and now back to 291. He likes carbs and tea.   Past Medical History:  Diagnosis Date  . Diabetes ([place])   . Heart attack ([place])   . Hyperlipidemia   . Hypertension   . Irregular heart beat   . Kidney disease     Past Surgical History:  Procedure Laterality Date  . LEFT KNEE REPLACEMENT    . RIGHT HIP REPLACEMENT    . STENTS  2008/2014     Medications: Current Meds    Medication Sig  . ACCU-CHEK SOFTCLIX LANCETS lancets Use as instructed to check blood sugar twice daily  . allopurinol (ZYLOPRIM) 300 MG tablet Take 300 mg by mouth daily.  Marland Kitchen atorvastatin (LIPITOR) 80 MG tablet Take 80 mg by mouth daily.  Marland Kitchen buPROPion (WELLBUTRIN XL) 150 MG 24 hr tablet Take 150 mg by mouth daily.  . carvedilol (COREG) 6.25 MG tablet Take 1 tablet (6.25 mg total) by mouth 2 (two) times daily with a meal.  . diltiazem (CARDIZEM SR) 60 MG 12 hr capsule Take 60 mg by mouth 2 (two) times daily.  Marland Kitchen docusate sodium (COLACE) 100 MG capsule Take 100 mg by mouth daily as needed for mild constipation.  Marland Kitchen ezetimibe (ZETIA) 10 MG tablet Take 10 mg by mouth daily.  . fenofibrate 54 MG tablet Take 54 mg by mouth daily.  . furosemide (LASIX) 20 MG tablet Take 20 mg by mouth.  Marland Kitchen glucose blood (ACCU-CHEK AVIVA) test strip 1 each by Other route 2 (two) times daily. And lancets 2/day  . HYDROcodone-acetaminophen (NORCO/VICODIN) 5-325 MG tablet Take 2 tablets by mouth at bedtime as needed for moderate pain.  Marland Kitchen insulin aspart (NOVOLOG) 100 UNIT/ML injection 3 times a  day (just before each meal) 20-10-20 units, and syringes 3/day  . isosorbide mononitrate (IMDUR) 60 MG 24 hr tablet TAKE 1 TABLET BY MOUTH ONCE DAILY  . losartan (COZAAR) 100 MG tablet Take 100 mg by mouth daily.  . nitroGLYCERIN (NITROSTAT) 0.4 MG SL tablet Place 1 tablet (0.4 mg total) under the tongue every 5 (five) minutes as needed for chest pain.  Marland Kitchen spironolactone (ALDACTONE) 25 MG tablet Take 25 mg by mouth daily.  Marland Kitchen warfarin (COUMADIN) 2.5 MG tablet Take 2.5 mg by mouth daily. TAKE 1 TABLET MON, WED, FRI AND A 1/2 TABLET TUES, THURS, SAT AND SUN  . [DISCONTINUED] nitroGLYCERIN (NITROSTAT) 0.4 MG SL tablet Place 1 tablet (0.4 mg total) under the tongue every 5 (five) minutes as needed for chest pain.     Allergies: No Known Allergies  Social History: The patient  reports that he has never smoked. He has never used  smokeless tobacco. He reports that he drinks alcohol. He reports that he does not use drugs.   Family History: The patient's family history includes Diabetes in his father; Heart disease in his father and mother.   Review of Systems: Please see the history of present illness.   Otherwise, the review of systems is positive for none.   All other systems are reviewed and negative.   Physical Exam: VS:  BP 120/68 (BP Location: Left Arm, Patient Position: Sitting, Cuff Size: Large)   Pulse 67   Ht 5\' 10"  (1.778 m)   Wt 291 lb 12.8 oz (132.4 kg)   BMI 41.87 kg/m  .  BMI Body mass index is 41.87 kg/m.  Wt Readings from Last 3 Encounters:  03/17/18 291 lb 12.8 oz (132.4 kg)  02/07/18 291 lb 9.6 oz (132.3 kg)  10/09/17 296 lb (134.3 kg)    General: Pleasant. Obese. Alert and in no acute distress.   HEENT: Normal.  Neck: Supple, no JVD, carotid bruits, or masses noted.  Cardiac: irregular irregular rhythm. Rate is ok. No murmurs, rubs, or gallops. Wound on the lower left leg - lots of varicosities noted.  Respiratory:  Lungs are clear to auscultation bilaterally with normal work of breathing.  GI: Soft and nontender.  MS: No deformity or atrophy. Gait and ROM intact.  Skin: Warm and dry. Color is normal.  Neuro:  Strength and sensation are intact and no gross focal deficits noted.  Psych: Alert, appropriate and with normal affect.   LABORATORY DATA:  EKG:  EKG is ordered today. This demonstrates AF with PVC.  Lab Results  Component Value Date   GLUCOSE 144 (H) 10/09/2017   NA 140 10/09/2017   K 5.2 (H) 10/09/2017   CL 109 10/09/2017   CREATININE 1.88 (H) 10/09/2017   BUN 34 (H) 10/09/2017   CO2 23 10/09/2017   HGBA1C 6.6 (A) 02/07/2018        BNP (last 3 results) No results for input(s): BNP in the last 8760 hours.  ProBNP (last 3 results) No results for input(s): PROBNP in the last 8760 hours.   Other Studies Reviewed Today:  Echo Study Conclusions 2016  -  Left ventricle: The cavity size was normal. Wall thickness was increased in a pattern of mild LVH. Systolic function was normal. The estimated ejection fraction was in the range of 55% to 60%. - Left atrium: The atrium was moderately dilated. - Atrial septum: No defect or patent foramen ovale was identified.  Assessment/Plan:  1. Permanaent AF - managed with rate control and  chronic anticoagulation. His INR is checked by his PCP. He is ok to have colonoscopy later this month.   2. Chronic anticoagulation - this is managed by his PCP. No problems noted.   3. CAD - no active symptoms. No changes made. NTG refilled today.   4. HLD - labs noted.   5. Obesity - he is working on this. Encouragement given along with my suggestions.   6. DM - followed by Dr. Loanne Drilling  7. Grief - he seems better today.   Current medicines are reviewed with the patient today.  The patient does not have concerns regarding medicines other than what has been noted above.  The following changes have been made:  See above.  Labs/ tests ordered today include:    Orders Placed This Encounter  Procedures  . Basic metabolic panel  . EKG 12-Lead     Disposition:   FU with me in 6 months.   Patient is agreeable to this plan and will call if any problems develop in the interim.   SignedTruitt Merle, NP  03/17/2018 9:12 AM  [facility] [address] [place],   [address] Phone: [phone] Fax: [phone]

## 2018-03-17 NOTE — Patient Instructions (Signed)
We will be checking the following labs today - BMET   Medication Instructions:    Continue with your current medicines.   I have refilled your NTG today    Testing/Procedures To Be Arranged:  N/A  Follow-Up:   See me in 6 months    Other Special Instructions:   N/A    If you need a refill on your cardiac medications before your next appointment, please call your pharmacy.   Call the [place] office at [phone] if you have any questions, problems or concerns.

## 2018-03-27 ENCOUNTER — Other Ambulatory Visit: Payer: Self-pay | Admitting: *Deleted

## 2018-03-27 DIAGNOSIS — R748 Abnormal levels of other serum enzymes: Secondary | ICD-10-CM

## 2018-03-31 DIAGNOSIS — F329 Major depressive disorder, single episode, unspecified: Secondary | ICD-10-CM | POA: Diagnosis not present

## 2018-03-31 DIAGNOSIS — N183 Chronic kidney disease, stage 3 (moderate): Secondary | ICD-10-CM | POA: Diagnosis not present

## 2018-03-31 DIAGNOSIS — I129 Hypertensive chronic kidney disease with stage 1 through stage 4 chronic kidney disease, or unspecified chronic kidney disease: Secondary | ICD-10-CM | POA: Diagnosis not present

## 2018-03-31 DIAGNOSIS — N2581 Secondary hyperparathyroidism of renal origin: Secondary | ICD-10-CM | POA: Diagnosis not present

## 2018-03-31 DIAGNOSIS — I4891 Unspecified atrial fibrillation: Secondary | ICD-10-CM | POA: Diagnosis not present

## 2018-03-31 DIAGNOSIS — D631 Anemia in chronic kidney disease: Secondary | ICD-10-CM | POA: Diagnosis not present

## 2018-03-31 DIAGNOSIS — E1122 Type 2 diabetes mellitus with diabetic chronic kidney disease: Secondary | ICD-10-CM | POA: Diagnosis not present

## 2018-04-04 ENCOUNTER — Other Ambulatory Visit: Payer: Medicare Other

## 2018-04-08 DIAGNOSIS — I4891 Unspecified atrial fibrillation: Secondary | ICD-10-CM | POA: Diagnosis not present

## 2018-04-08 DIAGNOSIS — Z7901 Long term (current) use of anticoagulants: Secondary | ICD-10-CM | POA: Diagnosis not present

## 2018-04-14 ENCOUNTER — Telehealth: Payer: Self-pay | Admitting: *Deleted

## 2018-04-14 NOTE — Telephone Encounter (Signed)
   Primary Cardiologist: Candee Furbish, MD  Chart reviewed as part of pre-operative protocol coverage. Truitt Merle saw the patient 03/17/18 and cleared him medically for colonoscopy, just needs input from pharm about Coumadin (recs will be routed to surgery to communicate with PCP who manages INR).  Charlie Pitter, PA-C 04/14/2018, 2:07 PM

## 2018-04-14 NOTE — Telephone Encounter (Signed)
Pt takes warfarin for afib with CHADS2VASc score of 4 (age, HTN, DM, CAD). Ok to hold warfarin for 5 days prior to procedure, does not need bridging.

## 2018-04-14 NOTE — Telephone Encounter (Signed)
Placed call to [facility] re: Surgical / Coumadin Clearance for upcoming Colonoscopy. Larene Beach has been made aware that pt is ok to hold Coumadin  For 5 days prior to surgery, but she needs to communicate with pt's pcp re: starting back of Coumadin for monitoring purposes. Larene Beach thanked me for the call.

## 2018-04-14 NOTE — Telephone Encounter (Signed)
   [facility] Pre-operative Risk Assessment    Request for surgical clearance:  1. What type of surgery is being performed? Colonoscopy   2. When is this surgery scheduled? 04/21/18   3. What type of clearance is required (medical clearance vs. Pharmacy clearance to hold med vs. Both)? both  4. Are there any medications that need to be held prior to surgery and how long?  Coumadin   5. Practice name and name of physician performing surgery?   [facility]   Dr Penelope Coop  6. What is your office phone number [phone]    7.   What is your office fax number [phone]  8.   Anesthesia type (None, local, MAC, general) ?    Trinidad Curet L 04/14/2018, 11:55 AM  _________________________________________________________________   (provider comments below)

## 2018-04-14 NOTE — Telephone Encounter (Signed)
   Primary Cardiologist:Mark Marlou Porch, MD  Chart reviewed as part of pre-operative protocol coverage. Pre-op clearance already addressed by colleagues in earlier phone notes. To summarize recommendations:  - Truitt Merle saw the patient 03/17/18 and cleared him medically for colonoscopy.  - Per pharmD recommendations, "Pt takes warfarin for afib with CHADS2VASc score of 4 (age, HTN, DM, CAD). Ok to hold warfarin for 5 days prior to procedure, does not need bridging."  We do not manage this patient's warfarin so would advise they be involved in Coumadin prep. Will route to callback staff to call GI office today to make them aware they need to involve PCP for coumadin monitoring with recommendations as above, since procedure is soon.  Will route this bundled recommendation to requesting provider via Epic fax function. Please call with questions.  Charlie Pitter, PA-C 04/14/2018, 3:46 PM

## 2018-04-23 DIAGNOSIS — I4891 Unspecified atrial fibrillation: Secondary | ICD-10-CM | POA: Diagnosis not present

## 2018-04-23 DIAGNOSIS — Z7901 Long term (current) use of anticoagulants: Secondary | ICD-10-CM | POA: Diagnosis not present

## 2018-05-14 DIAGNOSIS — Z7901 Long term (current) use of anticoagulants: Secondary | ICD-10-CM | POA: Diagnosis not present

## 2018-05-14 DIAGNOSIS — I4891 Unspecified atrial fibrillation: Secondary | ICD-10-CM | POA: Diagnosis not present

## 2018-05-14 DIAGNOSIS — Z23 Encounter for immunization: Secondary | ICD-10-CM | POA: Diagnosis not present

## 2018-06-05 DIAGNOSIS — Z7901 Long term (current) use of anticoagulants: Secondary | ICD-10-CM | POA: Diagnosis not present

## 2018-06-05 DIAGNOSIS — I4891 Unspecified atrial fibrillation: Secondary | ICD-10-CM | POA: Diagnosis not present

## 2018-06-13 ENCOUNTER — Ambulatory Visit: Payer: Medicare Other | Admitting: Endocrinology

## 2018-06-17 ENCOUNTER — Ambulatory Visit: Payer: Medicare Other | Admitting: Endocrinology

## 2018-06-24 DIAGNOSIS — J019 Acute sinusitis, unspecified: Secondary | ICD-10-CM | POA: Diagnosis not present

## 2018-07-03 ENCOUNTER — Ambulatory Visit (INDEPENDENT_AMBULATORY_CARE_PROVIDER_SITE_OTHER): Payer: Medicare Other | Admitting: Endocrinology

## 2018-07-03 ENCOUNTER — Encounter: Payer: Self-pay | Admitting: Endocrinology

## 2018-07-03 ENCOUNTER — Encounter (INDEPENDENT_AMBULATORY_CARE_PROVIDER_SITE_OTHER): Payer: Self-pay

## 2018-07-03 VITALS — BP 132/84 | HR 93 | Ht 70.0 in | Wt 292.0 lb

## 2018-07-03 DIAGNOSIS — N183 Chronic kidney disease, stage 3 unspecified: Secondary | ICD-10-CM

## 2018-07-03 DIAGNOSIS — E1122 Type 2 diabetes mellitus with diabetic chronic kidney disease: Secondary | ICD-10-CM

## 2018-07-03 DIAGNOSIS — I259 Chronic ischemic heart disease, unspecified: Secondary | ICD-10-CM

## 2018-07-03 DIAGNOSIS — Z794 Long term (current) use of insulin: Secondary | ICD-10-CM

## 2018-07-03 LAB — POCT GLYCOSYLATED HEMOGLOBIN (HGB A1C): Hemoglobin A1C: 6.6 % — AB (ref 4.0–5.6)

## 2018-07-03 NOTE — Progress Notes (Signed)
Subjective:    Patient ID: Shane Kelly, male    DOB: 08-14-1950, 67 y.o.   MRN: [id number]  HPI Pt returns for f/u of diabetes mellitus: DM type: Insulin-requiring type 2.   Dx'ed: 1443 Complications: polyneuropathy, CAD, and renal failure.  Therapy: insulin since soon after dx DKA: never Severe hypoglycemia: never Pancreatitis: never Pancreatic imaging:  Other: he takes multiple daily injections; due to renal failure, he does not need basal insulin; fructosamine has shown similar glycemic control to A1c Interval history: no cbg record, but states cbg's are in the 100's.  There is no trend throughout the day.  He says he seldom misses the insulin.  pt states he feels well in general.   Past Medical History:  Diagnosis Date  . Diabetes ([place])   . Heart attack ([place])   . Hyperlipidemia   . Hypertension   . Irregular heart beat   . Kidney disease     Past Surgical History:  Procedure Laterality Date  . LEFT KNEE REPLACEMENT    . RIGHT HIP REPLACEMENT    . Erasmo Downer  2008/2014    Social History   Socioeconomic History  . Marital status: Married    Spouse name: Not on file  . Number of children: Not on file  . Years of education: Not on file  . Highest education level: Not on file  Occupational History  . Not on file  Social Needs  . Financial resource strain: Not on file  . Food insecurity:    Worry: Not on file    Inability: Not on file  . Transportation needs:    Medical: Not on file    Non-medical: Not on file  Tobacco Use  . Smoking status: Never Smoker  . Smokeless tobacco: Never Used  Substance and Sexual Activity  . Alcohol use: Yes    Alcohol/week: 0.0 standard drinks  . Drug use: No  . Sexual activity: Not on file  Lifestyle  . Physical activity:    Days per week: Not on file    Minutes per session: Not on file  . Stress: Not on file  Relationships  . Social connections:    Talks on phone: Not on file    Gets together: Not on file    Attends  religious service: Not on file    Active member of club or organization: Not on file    Attends meetings of clubs or organizations: Not on file    Relationship status: Not on file  . Intimate partner violence:    Fear of current or ex partner: Not on file    Emotionally abused: Not on file    Physically abused: Not on file    Forced sexual activity: Not on file  Other Topics Concern  . Not on file  Social History Narrative  . Not on file    Current Outpatient Medications on File Prior to Visit  Medication Sig Dispense Refill  . ACCU-CHEK SOFTCLIX LANCETS lancets Use as instructed to check blood sugar twice daily 100 each 12  . allopurinol (ZYLOPRIM) 300 MG tablet Take 300 mg by mouth daily.    Marland Kitchen atorvastatin (LIPITOR) 80 MG tablet Take 80 mg by mouth daily.    Marland Kitchen buPROPion (WELLBUTRIN XL) 150 MG 24 hr tablet Take 150 mg by mouth daily.    . carvedilol (COREG) 6.25 MG tablet Take 1 tablet (6.25 mg total) by mouth 2 (two) times daily with a meal. 180 tablet 3  . diltiazem (CARDIZEM  SR) 60 MG 12 hr capsule Take 60 mg by mouth 2 (two) times daily.    Marland Kitchen docusate sodium (COLACE) 100 MG capsule Take 100 mg by mouth daily as needed for mild constipation.    Marland Kitchen ezetimibe (ZETIA) 10 MG tablet Take 10 mg by mouth daily.    . fenofibrate 54 MG tablet Take 54 mg by mouth daily.    . furosemide (LASIX) 20 MG tablet Take 20 mg by mouth.    Marland Kitchen glucose blood (ACCU-CHEK AVIVA) test strip 1 each by Other route 2 (two) times daily. And lancets 2/day 100 each 12  . HYDROcodone-acetaminophen (NORCO/VICODIN) 5-325 MG tablet Take 2 tablets by mouth at bedtime as needed for moderate pain.    Marland Kitchen insulin aspart (NOVOLOG) 100 UNIT/ML injection 3 times a day (just before each meal) 20-10-20 units, and syringes 3/day 2 vial 6  . isosorbide mononitrate (IMDUR) 60 MG 24 hr tablet TAKE 1 TABLET BY MOUTH ONCE DAILY 90 tablet 2  . losartan (COZAAR) 100 MG tablet Take 100 mg by mouth daily.    . nitroGLYCERIN (NITROSTAT)  0.4 MG SL tablet Place 1 tablet (0.4 mg total) under the tongue every 5 (five) minutes as needed for chest pain. 25 tablet prn  . warfarin (COUMADIN) 2.5 MG tablet Take 2.5 mg by mouth daily. TAKE 1 TABLET MON, WED, FRI AND A 1/2 TABLET TUES, THURS, SAT AND SUN     No current facility-administered medications on file prior to visit.     No Known Allergies  Family History  Problem Relation Age of Onset  . Diabetes Father   . Heart disease Father   . Heart disease Mother   . Cancer Neg Hx     BP 132/84 (BP Location: Left Arm, Patient Position: Sitting, Cuff Size: Normal)   Pulse 93   Ht 5\' 10"  (1.778 m)   Wt 292 lb (132.5 kg)   SpO2 95%   BMI 41.90 kg/m    Review of Systems He denies hypoglycemia.     Objective:   Physical Exam VITAL SIGNS:  See vs page GENERAL: no distress SKIN: no ulcer on the feet. CV: trace bilat leg edema, and bilat large vv's EXTEMITIES: no deformity.  feet are of normal temp, but there is severe hyperpigmentation of the legs.   There is bilateral onychomycosis of the toenails.   PULSES: dorsalis pedis intact bilat.   NEURO:  sensation is intact to touch on the feet, but decreased from normal.   Lab Results  Component Value Date   HGBA1C 6.6 (A) 07/03/2018   Lab Results  Component Value Date   CREATININE 2.76 (H) 03/17/2018   BUN 45 (H) 03/17/2018   NA 139 03/17/2018   K 4.8 03/17/2018   CL 100 03/17/2018   CO2 24 03/17/2018       Assessment & Plan:  Insulin-requiring type 2 DM, with CAD: this is the best control this pt should aim for, given variable cbg's.  Renal failure: in this setting, he does not need basal insulin. Edema: this limits rx options.    Patient Instructions  check your blood sugar twice a day.  vary the time of day when you check, between before the 3 meals, and at bedtime.  also check if you have symptoms of your blood sugar being too high or too low.  please keep a record of the readings and bring it to your next  appointment here (or you can bring the meter itself).  You  can write it on any piece of paper.  please call us sooner if your blood sugar goes below 70, or if you have a lot of readings over 200. Please continue the same insulins.   Please come back for a follow-up appointment in 4 months.

## 2018-07-03 NOTE — Patient Instructions (Addendum)
check your blood sugar twice a day.  vary the time of day when you check, between before the 3 meals, and at bedtime.  also check if you have symptoms of your blood sugar being too high or too low.  please keep a record of the readings and bring it to your next appointment here (or you can bring the meter itself).  You can write it on any piece of paper.  please call us sooner if your blood sugar goes below 70, or if you have a lot of readings over 200.  Please continue the same insulins.  Please come back for a follow-up appointment in 4 months.   

## 2018-07-04 DIAGNOSIS — I4891 Unspecified atrial fibrillation: Secondary | ICD-10-CM | POA: Diagnosis not present

## 2018-07-04 DIAGNOSIS — Z7901 Long term (current) use of anticoagulants: Secondary | ICD-10-CM | POA: Diagnosis not present

## 2018-07-31 DIAGNOSIS — I4891 Unspecified atrial fibrillation: Secondary | ICD-10-CM | POA: Diagnosis not present

## 2018-07-31 DIAGNOSIS — F324 Major depressive disorder, single episode, in partial remission: Secondary | ICD-10-CM | POA: Diagnosis not present

## 2018-07-31 DIAGNOSIS — Z7901 Long term (current) use of anticoagulants: Secondary | ICD-10-CM | POA: Diagnosis not present

## 2018-07-31 DIAGNOSIS — N184 Chronic kidney disease, stage 4 (severe): Secondary | ICD-10-CM | POA: Diagnosis not present

## 2018-07-31 DIAGNOSIS — M1A9XX0 Chronic gout, unspecified, without tophus (tophi): Secondary | ICD-10-CM | POA: Diagnosis not present

## 2018-07-31 DIAGNOSIS — I10 Essential (primary) hypertension: Secondary | ICD-10-CM | POA: Diagnosis not present

## 2018-07-31 DIAGNOSIS — E1122 Type 2 diabetes mellitus with diabetic chronic kidney disease: Secondary | ICD-10-CM | POA: Diagnosis not present

## 2018-07-31 DIAGNOSIS — E782 Mixed hyperlipidemia: Secondary | ICD-10-CM | POA: Diagnosis not present

## 2018-08-01 ENCOUNTER — Other Ambulatory Visit: Payer: Self-pay

## 2018-08-01 MED ORDER — CARVEDILOL 6.25 MG PO TABS: 6.2500 mg | ORAL_TABLET | Freq: Two times a day (BID) | ORAL | 2 refills | Status: DC

## 2018-08-01 MED ORDER — ISOSORBIDE MONONITRATE ER 60 MG PO TB24: 60.0000 mg | ORAL_TABLET | Freq: Every day | ORAL | 2 refills | Status: DC

## 2018-08-05 DIAGNOSIS — Z1211 Encounter for screening for malignant neoplasm of colon: Secondary | ICD-10-CM | POA: Diagnosis not present

## 2018-08-26 ENCOUNTER — Encounter: Payer: Self-pay | Admitting: Nurse Practitioner

## 2018-08-28 DIAGNOSIS — I4891 Unspecified atrial fibrillation: Secondary | ICD-10-CM | POA: Diagnosis not present

## 2018-08-28 DIAGNOSIS — Z7901 Long term (current) use of anticoagulants: Secondary | ICD-10-CM | POA: Diagnosis not present

## 2018-08-29 ENCOUNTER — Other Ambulatory Visit: Payer: Self-pay | Admitting: Cardiology

## 2018-08-29 MED ORDER — NITROGLYCERIN 0.4 MG SL SUBL: 0.4000 mg | SUBLINGUAL_TABLET | SUBLINGUAL | 1 refills | Status: DC | PRN

## 2018-08-29 NOTE — Telephone Encounter (Signed)
Pt's medication was sent to pt's pharmacy as requested. Confirmation received.  °

## 2018-09-10 ENCOUNTER — Telehealth: Payer: Self-pay | Admitting: *Deleted

## 2018-09-10 NOTE — Telephone Encounter (Signed)
Lvm pt's appt has been moved due to [facility] having meeting the day of appt moved to next day same time.  If pt cannot keep will call office to reschedule.

## 2018-09-16 ENCOUNTER — Ambulatory Visit: Payer: Medicare HMO | Admitting: Nurse Practitioner

## 2018-09-16 DIAGNOSIS — Z7901 Long term (current) use of anticoagulants: Secondary | ICD-10-CM | POA: Diagnosis not present

## 2018-09-16 DIAGNOSIS — I4891 Unspecified atrial fibrillation: Secondary | ICD-10-CM | POA: Diagnosis not present

## 2018-09-17 ENCOUNTER — Ambulatory Visit: Payer: Medicare HMO | Admitting: Nurse Practitioner

## 2018-09-22 DIAGNOSIS — D631 Anemia in chronic kidney disease: Secondary | ICD-10-CM | POA: Diagnosis not present

## 2018-09-22 DIAGNOSIS — I4891 Unspecified atrial fibrillation: Secondary | ICD-10-CM | POA: Diagnosis not present

## 2018-09-22 DIAGNOSIS — M109 Gout, unspecified: Secondary | ICD-10-CM | POA: Diagnosis not present

## 2018-09-22 DIAGNOSIS — E1122 Type 2 diabetes mellitus with diabetic chronic kidney disease: Secondary | ICD-10-CM | POA: Diagnosis not present

## 2018-09-22 DIAGNOSIS — F329 Major depressive disorder, single episode, unspecified: Secondary | ICD-10-CM | POA: Diagnosis not present

## 2018-09-22 DIAGNOSIS — N183 Chronic kidney disease, stage 3 (moderate): Secondary | ICD-10-CM | POA: Diagnosis not present

## 2018-09-22 DIAGNOSIS — I129 Hypertensive chronic kidney disease with stage 1 through stage 4 chronic kidney disease, or unspecified chronic kidney disease: Secondary | ICD-10-CM | POA: Diagnosis not present

## 2018-09-22 DIAGNOSIS — N2581 Secondary hyperparathyroidism of renal origin: Secondary | ICD-10-CM | POA: Diagnosis not present

## 2018-09-23 DIAGNOSIS — N183 Chronic kidney disease, stage 3 (moderate): Secondary | ICD-10-CM | POA: Diagnosis not present

## 2018-09-29 ENCOUNTER — Ambulatory Visit: Payer: Medicare HMO | Admitting: Nurse Practitioner

## 2018-10-15 DIAGNOSIS — I4891 Unspecified atrial fibrillation: Secondary | ICD-10-CM | POA: Diagnosis not present

## 2018-10-15 DIAGNOSIS — Z7901 Long term (current) use of anticoagulants: Secondary | ICD-10-CM | POA: Diagnosis not present

## 2018-10-29 IMAGING — US US RENAL
1 series · 14 of 25 positions shown · non-contrast
Comparison: None.

CLINICAL DATA: Chronic kidney disease, stage III.

EXAM:
RENAL / URINARY TRACT ULTRASOUND COMPLETE

[Series 1: us renal · 0.29mm/px · 14 of 36 slices shown]
[im 1/36]
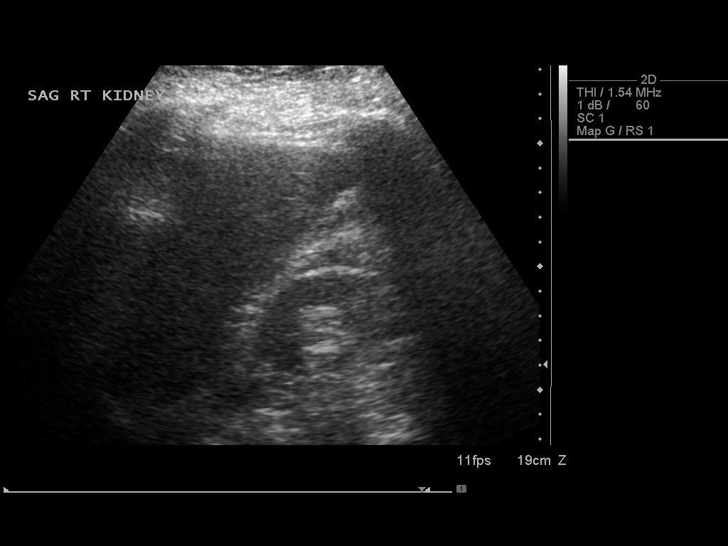
[im 3/36]
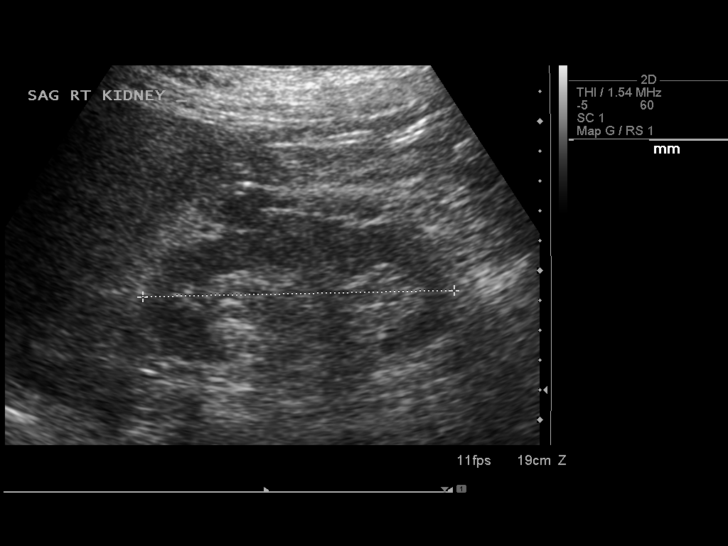
[im 6/36]
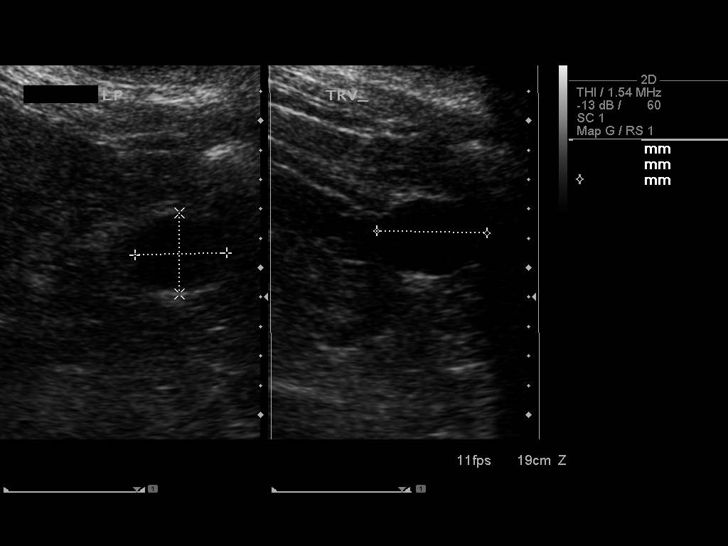
[im 9/36]
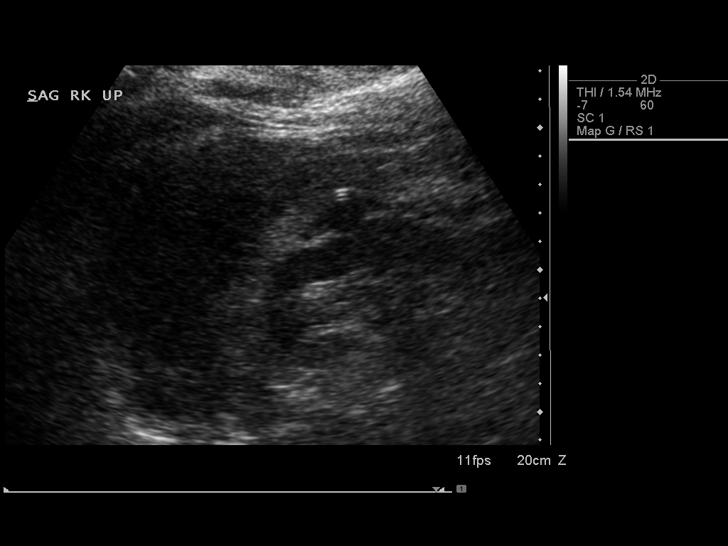
[im 12/36]
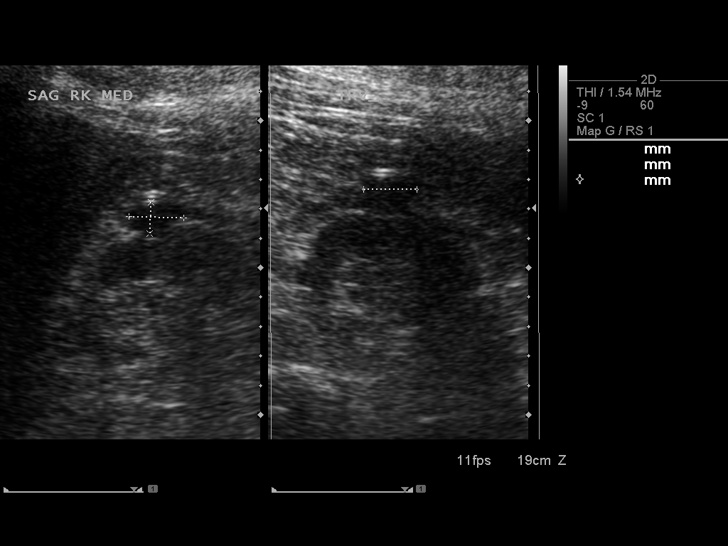
[im 14/36]
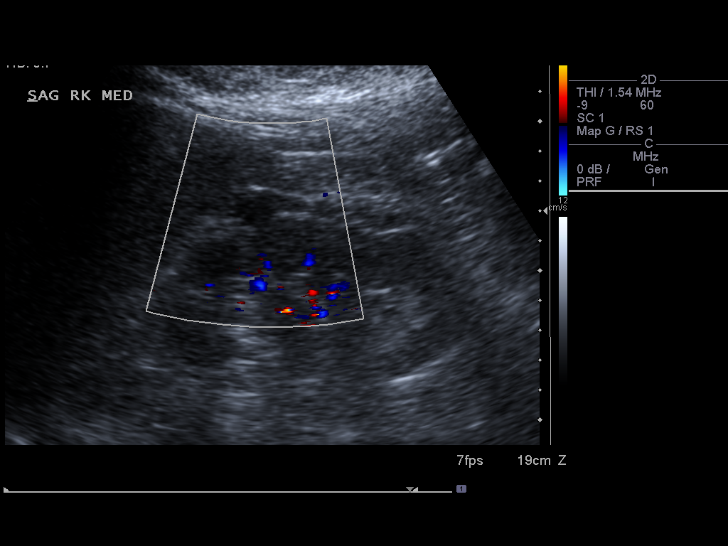
[im 17/36]
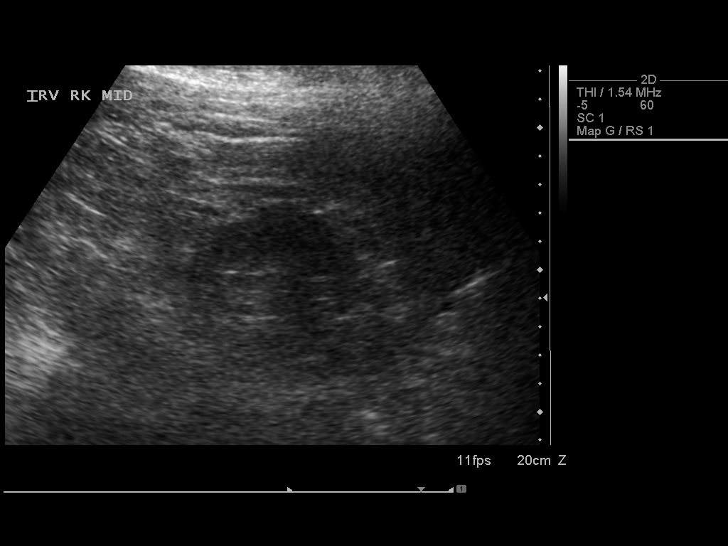
[im 19/36]
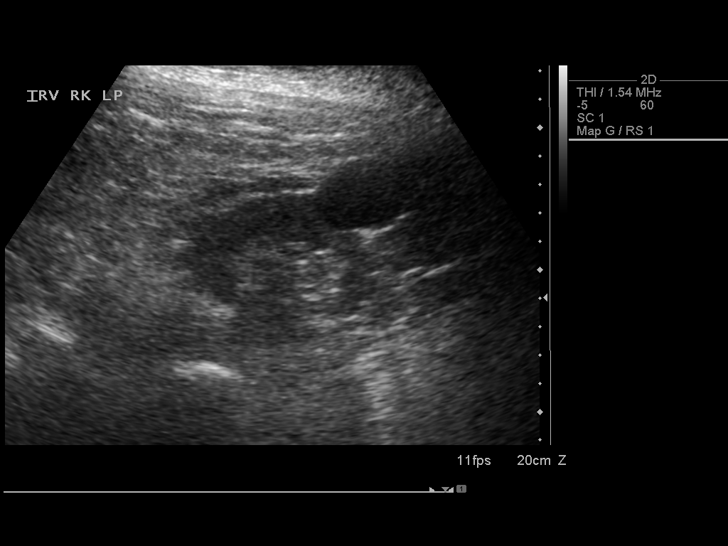
[im 22/36]
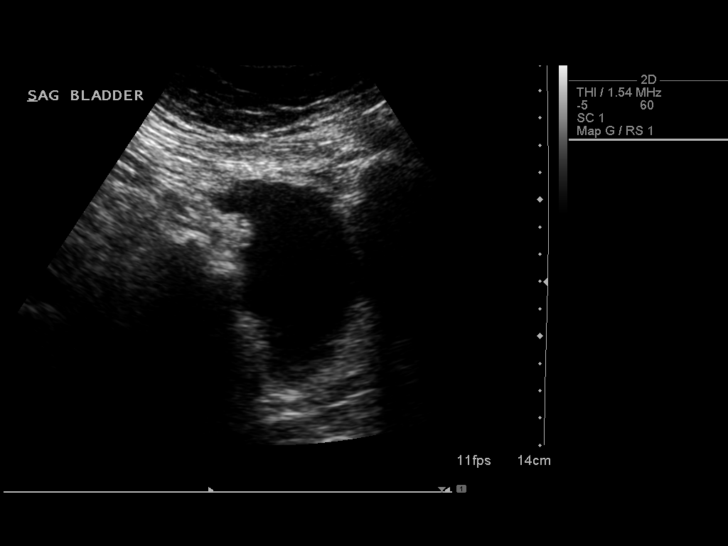
[im 24/36]
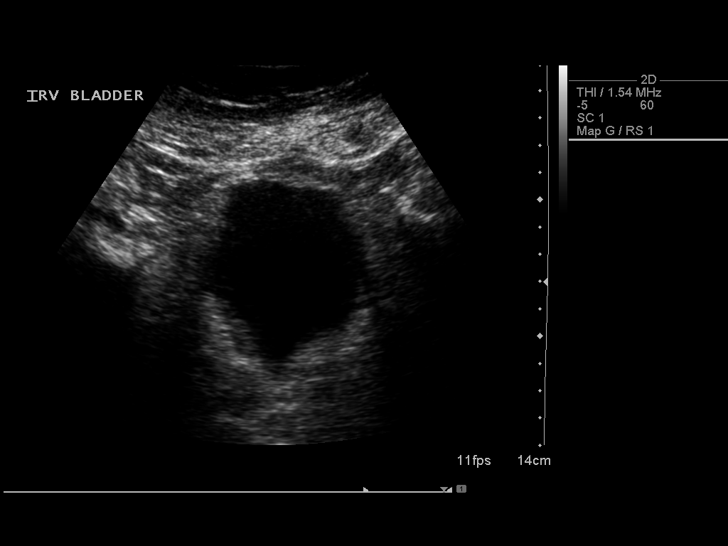
[im 27/36]
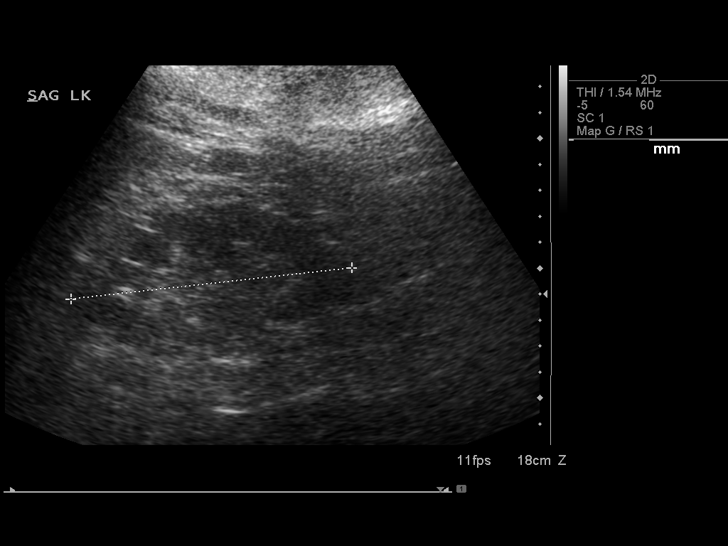
[im 30/36]
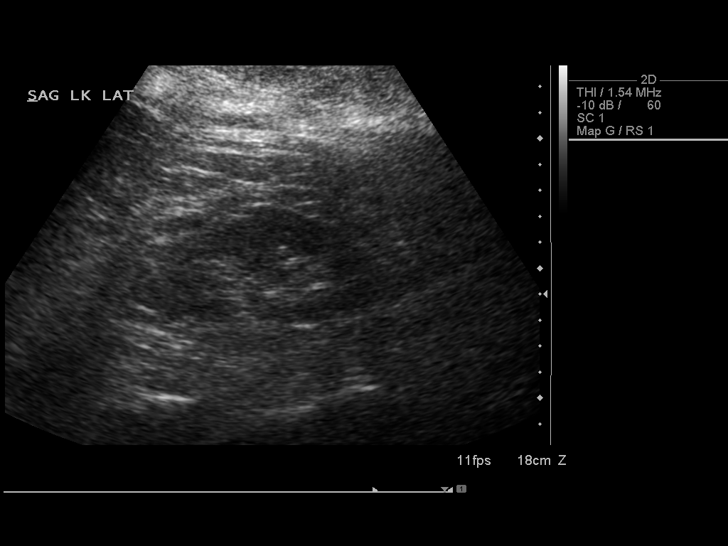
[im 33/36]
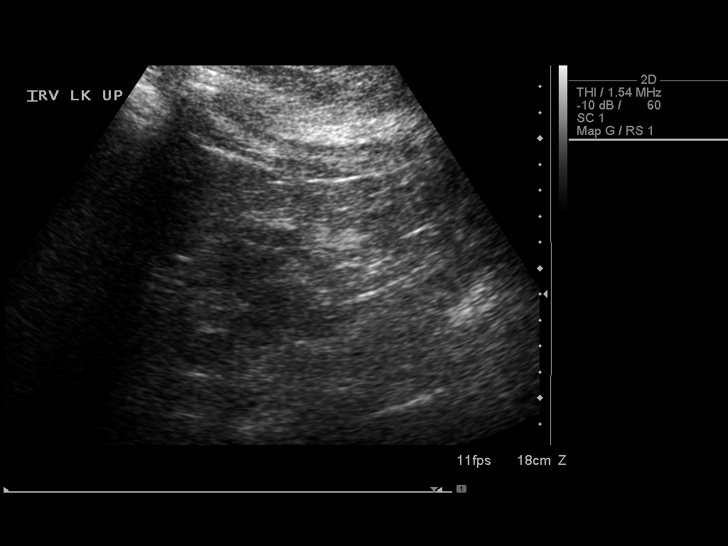
[im 36/36]
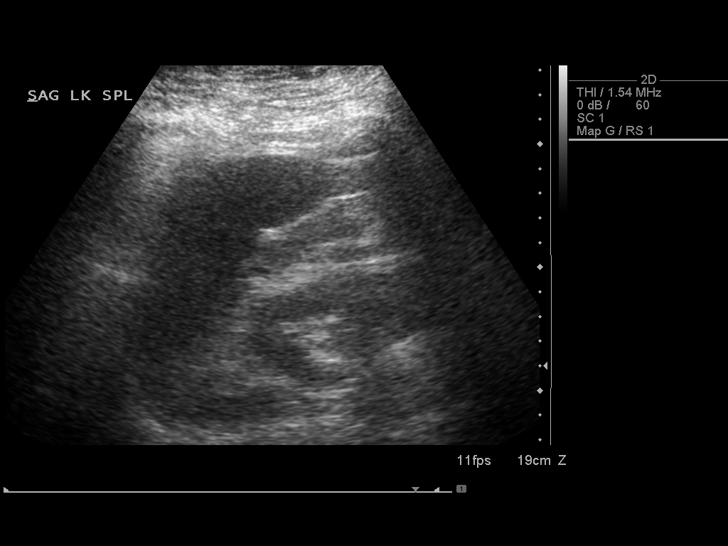

[14 of 25 positions shown; findings below may reference images not displayed]

FINDINGS: Right Kidney:

Length: 10.9 cm. Two simple appearing cysts, 3 cm of the lower pole
and nearly 2 cm in the interpolar region. No hydronephrosis. Normal
cortical echogenicity.

Left Kidney:

Length: 11 cm. Echogenicity within normal limits. No mass or
hydronephrosis visualized.

Bladder:

Lobulated appearance of the bladder in the sagittal projection is
likely from underdistention. A right ureteral jet was captured. No
visible bladder filling defect.
IMPRESSION: 1. No hydronephrosis.
2. Normal, symmetric bilateral renal length.
3. Simple right renal cysts.

## 2018-11-03 ENCOUNTER — Ambulatory Visit: Payer: Medicare HMO

## 2018-11-04 ENCOUNTER — Encounter: Payer: Self-pay | Admitting: Endocrinology

## 2018-11-04 ENCOUNTER — Other Ambulatory Visit: Payer: Self-pay

## 2018-11-04 ENCOUNTER — Ambulatory Visit (INDEPENDENT_AMBULATORY_CARE_PROVIDER_SITE_OTHER): Payer: Medicare HMO | Admitting: Endocrinology

## 2018-11-04 DIAGNOSIS — N183 Chronic kidney disease, stage 3 unspecified: Secondary | ICD-10-CM

## 2018-11-04 DIAGNOSIS — Z794 Long term (current) use of insulin: Secondary | ICD-10-CM | POA: Diagnosis not present

## 2018-11-04 DIAGNOSIS — E1122 Type 2 diabetes mellitus with diabetic chronic kidney disease: Secondary | ICD-10-CM

## 2018-11-04 NOTE — Patient Instructions (Addendum)
check your blood sugar twice a day.  vary the time of day when you check, between before the 3 meals, and at bedtime.  also check if you have symptoms of your blood sugar being too high or too low.  please keep a record of the readings and bring it to your next appointment here (or you can bring the meter itself).  You can write it on any piece of paper.  please call us sooner if your blood sugar goes below 70, or if you have a lot of readings over 200. Please continue the same insulin.   Please come back for a follow-up appointment in 4 months.    

## 2018-11-04 NOTE — Progress Notes (Addendum)
Subjective:    Patient ID: Shane Kelly, male    DOB: 06-Mar-1951, 68 y.o.   MRN: [id number]  HPI  telehealth visit today via doxy video visit.  Alternatives to telehealth are presented to this patient, and the patient agrees to the telehealth visit. Pt is advised of the cost of the visit, and agrees to this, also.   Patient is at home, and I am at the office.   Pt returns for f/u of diabetes mellitus: DM type: Insulin-requiring type 2.   Dx'ed: 6720 Complications: polyneuropathy, CAD, and renal failure.  Therapy: insulin since soon after dx DKA: never Severe hypoglycemia: never Pancreatitis: never Pancreatic imaging: never Other: he takes multiple daily injections; due to renal failure, he does not need basal insulin; fructosamine has shown similar glycemic control to A1c.  Interval history: no cbg record, but states cbg's are in the low to mid-100's.  There is no trend throughout the day.  He says he still seldom misses the insulin.  pt states he feels well in general.  He often skips lunch (and lunch insulin).   He says a1c at PCP 1 month ago was 6.7%.  Past Medical History:  Diagnosis Date  . Diabetes ([place])   . Heart attack ([place])   . Hyperlipidemia   . Hypertension   . Irregular heart beat   . Kidney disease     Past Surgical History:  Procedure Laterality Date  . LEFT KNEE REPLACEMENT    . RIGHT HIP REPLACEMENT    . Erasmo Downer  2008/2014    Social History   Socioeconomic History  . Marital status: Married    Spouse name: Not on file  . Number of children: Not on file  . Years of education: Not on file  . Highest education level: Not on file  Occupational History  . Not on file  Social Needs  . Financial resource strain: Not on file  . Food insecurity:    Worry: Not on file    Inability: Not on file  . Transportation needs:    Medical: Not on file    Non-medical: Not on file  Tobacco Use  . Smoking status: Never Smoker  . Smokeless tobacco: Never Used   Substance and Sexual Activity  . Alcohol use: Yes    Alcohol/week: 0.0 standard drinks  . Drug use: No  . Sexual activity: Not on file  Lifestyle  . Physical activity:    Days per week: Not on file    Minutes per session: Not on file  . Stress: Not on file  Relationships  . Social connections:    Talks on phone: Not on file    Gets together: Not on file    Attends religious service: Not on file    Active member of club or organization: Not on file    Attends meetings of clubs or organizations: Not on file    Relationship status: Not on file  . Intimate partner violence:    Fear of current or ex partner: Not on file    Emotionally abused: Not on file    Physically abused: Not on file    Forced sexual activity: Not on file  Other Topics Concern  . Not on file  Social History Narrative  . Not on file    Current Outpatient Medications on File Prior to Visit  Medication Sig Dispense Refill  . ACCU-CHEK SOFTCLIX LANCETS lancets Use as instructed to check blood sugar twice daily 100 each 12  .  allopurinol (ZYLOPRIM) 300 MG tablet Take 300 mg by mouth daily.    Marland Kitchen atorvastatin (LIPITOR) 80 MG tablet Take 80 mg by mouth daily.    Marland Kitchen buPROPion (WELLBUTRIN XL) 150 MG 24 hr tablet Take 150 mg by mouth daily.    . carvedilol (COREG) 6.25 MG tablet Take 1 tablet (6.25 mg total) by mouth 2 (two) times daily with a meal. 180 tablet 2  . diltiazem (CARDIZEM SR) 60 MG 12 hr capsule Take 60 mg by mouth 2 (two) times daily.    Marland Kitchen docusate sodium (COLACE) 100 MG capsule Take 100 mg by mouth daily as needed for mild constipation.    Marland Kitchen ezetimibe (ZETIA) 10 MG tablet Take 10 mg by mouth daily.    . fenofibrate 54 MG tablet Take 54 mg by mouth daily.    . furosemide (LASIX) 20 MG tablet Take 20 mg by mouth.    Marland Kitchen glucose blood (ACCU-CHEK AVIVA) test strip 1 each by Other route 2 (two) times daily. And lancets 2/day 100 each 12  . HYDROcodone-acetaminophen (NORCO/VICODIN) 5-325 MG tablet Take 2 tablets  by mouth at bedtime as needed for moderate pain.    Marland Kitchen insulin aspart (NOVOLOG) 100 UNIT/ML injection 3 times a day (just before each meal) 20-10-20 units, and syringes 3/day 2 vial 6  . isosorbide mononitrate (IMDUR) 60 MG 24 hr tablet Take 1 tablet (60 mg total) by mouth daily. 90 tablet 2  . losartan (COZAAR) 100 MG tablet Take 100 mg by mouth daily.    . nitroGLYCERIN (NITROSTAT) 0.4 MG SL tablet Place 1 tablet (0.4 mg total) under the tongue every 5 (five) minutes as needed for chest pain. 75 tablet 1  . warfarin (COUMADIN) 2.5 MG tablet Take 2.5 mg by mouth daily. TAKE 1 TABLET MON, WED, FRI AND A 1/2 TABLET TUES, THURS, SAT AND SUN     No current facility-administered medications on file prior to visit.     No Known Allergies  Family History  Problem Relation Age of Onset  . Diabetes Father   . Heart disease Father   . Heart disease Mother   . Cancer Neg Hx    Review of Systems He denies hypoglycemia.     Objective:   Physical Exam    Lab Results  Component Value Date   CREATININE 2.76 (H) 03/17/2018   BUN 45 (H) 03/17/2018   NA 139 03/17/2018   K 4.8 03/17/2018   CL 100 03/17/2018   CO2 24 03/17/2018      Assessment & Plan:  Insulin-requiring type 2 DM, with PN: well-controlled.   CAD: I this context, he needs to avoid hypoglycemia. Renal failure: in this context, he does not need basal insulin.   Patient Instructions  check your blood sugar twice a day.  vary the time of day when you check, between before the 3 meals, and at bedtime.  also check if you have symptoms of your blood sugar being too high or too low.  please keep a record of the readings and bring it to your next appointment here (or you can bring the meter itself).  You can write it on any piece of paper.  please call us sooner if your blood sugar goes below 70, or if you have a lot of readings over 200. Please continue the same insulin.  Please come back for a follow-up appointment in 4 months.

## 2018-11-19 DIAGNOSIS — I4891 Unspecified atrial fibrillation: Secondary | ICD-10-CM | POA: Diagnosis not present

## 2018-11-19 DIAGNOSIS — Z7901 Long term (current) use of anticoagulants: Secondary | ICD-10-CM | POA: Diagnosis not present

## 2018-12-18 DIAGNOSIS — G894 Chronic pain syndrome: Secondary | ICD-10-CM | POA: Diagnosis not present

## 2018-12-18 DIAGNOSIS — I4891 Unspecified atrial fibrillation: Secondary | ICD-10-CM | POA: Diagnosis not present

## 2018-12-18 DIAGNOSIS — L239 Allergic contact dermatitis, unspecified cause: Secondary | ICD-10-CM | POA: Diagnosis not present

## 2018-12-19 ENCOUNTER — Telehealth: Payer: Self-pay | Admitting: Endocrinology

## 2018-12-19 NOTE — Telephone Encounter (Signed)
Please advise 

## 2018-12-19 NOTE — Telephone Encounter (Signed)
Patient has been put on a 7 day course of prednisone and needs the sliding scale chart for his insulin so that he can adjust his insulin dosing to compensate for the elevated BS  Call back [phone]

## 2018-12-19 NOTE — Telephone Encounter (Signed)
Please take an extra 2 units for any blood sugar in the 200's, and 5 units if it is over 300.  You can do this every 3 hrs.

## 2018-12-19 NOTE — Telephone Encounter (Signed)
LVM requesting returned call 

## 2018-12-22 ENCOUNTER — Other Ambulatory Visit: Payer: Self-pay | Admitting: Endocrinology

## 2018-12-22 NOTE — Telephone Encounter (Signed)
Pt returned call. Informed of Dr. Cordelia Pen orders. Verbalized acceptance and understanding.

## 2018-12-22 NOTE — Telephone Encounter (Signed)
SECOND ATTEMPT: ° °LVM requesting returned call. °

## 2018-12-29 ENCOUNTER — Telehealth: Payer: Self-pay | Admitting: *Deleted

## 2018-12-29 NOTE — Telephone Encounter (Signed)
Virtual Visit Pre-Appointment Phone Call  "(Name), I am calling you today to discuss your upcoming appointment. We are currently trying to limit exposure to the virus that causes COVID-19 by seeing patients at home rather than in the office."  1. "What is the BEST phone number to call the day of the visit?" - include this in appointment notes  2. "Do you have or have access to (through a family member/friend) a smartphone with video capability that we can use for your visit?" a. If yes - list this number in appt notes as "cell" (if different from BEST phone #) and list the appointment type as a VIDEO visit in appointment notes b. If no - list the appointment type as a PHONE visit in appointment notes  3. Confirm consent - "In the setting of the current Covid19 crisis, you are scheduled for a (phone or video) visit with your provider on (Monday, June 15) at (9:30 am).  Just as we do with many in-office visits, in order for you to participate in this visit, we must obtain consent.  If you'd like, I can send this to your mychart (if signed up) or email for you to review.  Otherwise, I can obtain your verbal consent now.  All virtual visits are billed to your insurance company just like a normal visit would be.  By agreeing to a virtual visit, we'd like you to understand that the technology does not allow for your provider to perform an examination, and thus may limit your provider's ability to fully assess your condition. If your provider identifies any concerns that need to be evaluated in person, we will make arrangements to do so.  Finally, though the technology is pretty good, we cannot assure that it will always work on either your or our end, and in the setting of a video visit, we may have to convert it to a phone-only visit.  In either situation, we cannot ensure that we have a secure connection.  Are you willing to proceed?" STAFF: Did the patient verbally acknowledge consent to telehealth  visit? Document YES/NO here: YES.  4. Advise patient to be prepared - "Two hours prior to your appointment, go ahead and check your blood pressure, pulse, oxygen saturation, and your weight (if you have the equipment to check those) and write them all down. When your visit starts, your provider will ask you for this information. If you have an Apple Watch or Kardia device, please plan to have heart rate information ready on the day of your appointment. Please have a pen and paper handy nearby the day of the visit as well."  5. Give patient instructions for MyChart download to smartphone OR Doximity/Doxy.me as below if video visit (depending on what platform provider is using)  6. Inform patient they will receive a phone call 15 minutes prior to their appointment time (may be from unknown caller ID) so they should be prepared to answer    TELEPHONE CALL NOTE  Shane Kelly has been deemed a candidate for a follow-up tele-health visit to limit community exposure during the Covid-19 pandemic. I spoke with the patient via phone to ensure availability of phone/video source, confirm preferred email & phone number, and discuss instructions and expectations.  I reminded Shane Kelly to be prepared with any vital sign and/or heart rhythm information that could potentially be obtained via home monitoring, at the time of his visit. I reminded Shane Kelly to expect a phone call prior to  his visit.  Dyna Figuereo Avanell Shackleton 12/29/2018 10:10 AM   INSTRUCTIONS FOR DOWNLOADING THE MYCHART APP TO SMARTPHONE  - The patient must first make sure to have activated MyChart and know their login information - If Apple, go to [employer] and type in MyChart in the search bar and download the app. If Android, ask patient to go to [employer] and type in [place] in the search bar and download the app. The app is free but as with any other app downloads, their phone may require them to verify saved payment  information or Apple/Android password.  - The patient will need to then log into the app with their MyChart username and password, and select [facility] as their healthcare provider to link the account. When it is time for your visit, go to the MyChart app, find appointments, and click Begin Video Visit. Be sure to Select Allow for your device to access the Microphone and Camera for your visit. You will then be connected, and your provider will be with you shortly.  **If they have any issues connecting, or need assistance please contact MyChart service desk [phone])**  **If using a computer, in order to ensure the best quality for their visit they will need to use either of the following Internet Browsers: [employer], or Google Chrome**  IF USING DOXIMITY or DOXY.ME - The patient will receive a link just prior to their visit by text.     FULL LENGTH CONSENT FOR TELE-HEALTH VISIT   I hereby voluntarily request, consent and authorize [facility] and its employed or contracted physicians, physician assistants, nurse practitioners or other licensed health care professionals (the Practitioner), to provide me with telemedicine health care services (the "Services") as deemed necessary by the treating Practitioner. I acknowledge and consent to receive the Services by the Practitioner via telemedicine. I understand that the telemedicine visit will involve communicating with the Practitioner through live audiovisual communication technology and the disclosure of certain medical information by electronic transmission. I acknowledge that I have been given the opportunity to request an in-person assessment or other available alternative prior to the telemedicine visit and am voluntarily participating in the telemedicine visit.  I understand that I have the right to withhold or withdraw my consent to the use of telemedicine in the course of my care at any time, without affecting my right  to future care or treatment, and that the Practitioner or I may terminate the telemedicine visit at any time. I understand that I have the right to inspect all information obtained and/or recorded in the course of the telemedicine visit and may receive copies of available information for a reasonable fee.  I understand that some of the potential risks of receiving the Services via telemedicine include:  Marland Kitchen Delay or interruption in medical evaluation due to technological equipment failure or disruption; . Information transmitted may not be sufficient (e.g. poor resolution of images) to allow for appropriate medical decision making by the Practitioner; and/or  . In rare instances, security protocols could fail, causing a breach of personal health information.  Furthermore, I acknowledge that it is my responsibility to provide information about my medical history, conditions and care that is complete and accurate to the best of my ability. I acknowledge that Practitioner's advice, recommendations, and/or decision may be based on factors not within their control, such as incomplete or inaccurate data provided by me or distortions of diagnostic images or specimens that may result from electronic transmissions. I  understand that the practice of medicine is not an exact science and that Practitioner makes no warranties or guarantees regarding treatment outcomes. I acknowledge that I will receive a copy of this consent concurrently upon execution via email to the email address I last provided but may also request a printed copy by calling the office of [facility].    I understand that my insurance will be billed for this visit.   I have read or had this consent read to me. . I understand the contents of this consent, which adequately explains the benefits and risks of the Services being provided via telemedicine.  . I have been provided ample opportunity to ask questions regarding this consent and the Services  and have had my questions answered to my satisfaction. . I give my informed consent for the services to be provided through the use of telemedicine in my medical care  By participating in this telemedicine visit I agree to the above.

## 2019-01-04 NOTE — Progress Notes (Signed)
Telehealth Visit     Virtual Visit via Video Note   This visit type was conducted due to national recommendations for restrictions regarding the COVID-19 Pandemic (e.g. social distancing) in an effort to limit this patient's exposure and mitigate transmission in our community.  Due to his co-morbid illnesses, this patient is at least at moderate risk for complications without adequate follow up.  This format is felt to be most appropriate for this patient at this time.  All issues noted in this document were discussed and addressed.  A limited physical exam was performed with this format.  Please refer to the patient's chart for his consent to telehealth for [facility].   Evaluation Performed:  Follow-up visit  This visit type was conducted due to national recommendations for restrictions regarding the COVID-19 Pandemic (e.g. social distancing).  This format is felt to be most appropriate for this patient at this time.  All issues noted in this document were discussed and addressed.  No physical exam was performed (except for noted visual exam findings with Video Visits).  Please refer to the patient's chart (MyChart message for video visits and phone note for telephone visits) for the patient's consent to telehealth for [facility].  Date:  01/05/2019   ID:  Shane Kelly, DOB 1951/01/12, MRN [id number]  Patient Location:  Home  Provider location:   Home  PCP:  Lois Huxley, PA  Cardiologist:  Servando Snare Candee Furbish, MD  Electrophysiologist:  None   Chief Complaint:  Follow up.   History of Present Illness:    Shane Kelly is a 68 y.o. male who presents via audio/video conferencing for a telehealth visit today.  Seen for Dr. Marlou Porch.   He has a history of DM, CAD, persistent AF, chronic anticoagulation, HTN, HLD, CKD and presumed ICM. He has had prior MI. He had prior coronary artery stent  2in2008 [place] has  had issues with anxiety that have resulted in chest discomfort.Echo from 2016 shows normal EF.INR is followed by PCP.  Last seen by Dr. Marlou Porch in July of 2018 - felt to be doing well.I have seen him since - he got very depressed due to his son's death - was using food as comfort. Was doing much better at our last visit in August of 2019. PCP monitors his coumadin. I checked a BMEt at that visit - kidney function markedly abnormal - we stopped his aldactone - he was referred to [facility] as well.   The patient does not have symptoms concerning for COVID-19 infection (fever, chills, cough, or new shortness of breath).   Seen today via Doximity video. He has consented for this visit. He admits he is nervous for this visit this morning. He has just finished some prednisone for some face/skin issue by his PCP. BP little high this morning - this was prior to taking his medicines this morning. Typically running 382 to 505 systolic range. He tries to limit his salt. [facility] [place]. No chest pain. Breathing is stable. He has been working in his garden. Tolerating his medicines. No problems with his Coumadin. He thanked me again for talking to him several visits back after his son had died.   Past Medical History:  Diagnosis Date  . Diabetes ([place])   . Heart attack ([place])   . Hyperlipidemia   . Hypertension   . Irregular heart beat   . Kidney disease  Past Surgical History:  Procedure Laterality Date  . LEFT KNEE REPLACEMENT    . RIGHT HIP REPLACEMENT    . STENTS  2008/2014     Current Meds  Medication Sig  . ACCU-CHEK SOFTCLIX LANCETS lancets Use as instructed to check blood sugar twice daily  . allopurinol (ZYLOPRIM) 300 MG tablet Take 300 mg by mouth daily.  Marland Kitchen atorvastatin (LIPITOR) 80 MG tablet Take 80 mg by mouth daily.  Marland Kitchen buPROPion (WELLBUTRIN XL) 150 MG 24 hr tablet Take 150 mg by mouth daily.  . carvedilol (COREG) 6.25 MG tablet Take 1 tablet (6.25 mg  total) by mouth 2 (two) times daily with a meal.  . diltiazem (CARDIZEM SR) 60 MG 12 hr capsule Take 60 mg by mouth 2 (two) times daily.  Marland Kitchen docusate sodium (COLACE) 100 MG capsule Take 100 mg by mouth daily as needed for mild constipation.  Marland Kitchen ezetimibe (ZETIA) 10 MG tablet Take 10 mg by mouth daily.  . fenofibrate 54 MG tablet Take 54 mg by mouth daily.  . furosemide (LASIX) 20 MG tablet Take 20 mg by mouth.  Marland Kitchen glucose blood test strip USE TO CHECK BLOOD SUGAR TWICE DAILY  . HYDROcodone-acetaminophen (NORCO/VICODIN) 5-325 MG tablet Take 2 tablets by mouth at bedtime as needed for moderate pain.  Marland Kitchen insulin aspart (NOVOLOG) 100 UNIT/ML injection 3 times a day (just before each meal) 20-10-20 units, and syringes 3/day  . isosorbide mononitrate (IMDUR) 60 MG 24 hr tablet Take 1 tablet (60 mg total) by mouth daily.  Marland Kitchen losartan (COZAAR) 100 MG tablet Take 100 mg by mouth daily.  . nitroGLYCERIN (NITROSTAT) 0.4 MG SL tablet Place 1 tablet (0.4 mg total) under the tongue every 5 (five) minutes as needed for chest pain.  Marland Kitchen warfarin (COUMADIN) 2.5 MG tablet Take 2.5 mg by mouth daily. TAKE 1 TABLET MON, WED, FRI AND A 1/2 TABLET TUES, THURS, SAT AND SUN     Allergies:   Patient has no known allergies.   Social History   Tobacco Use  . Smoking status: Never Smoker  . Smokeless tobacco: Never Used  Substance Use Topics  . Alcohol use: Yes    Alcohol/week: 0.0 standard drinks  . Drug use: No     Family Hx: The patient's family history includes Diabetes in his father; Heart disease in his father and mother. There is no history of Cancer.  ROS:   Please see the history of present illness.   All other systems reviewed are negative.    Objective:    Vital Signs:  BP (!) 160/72   Ht 5\' 11"  (1.803 m)   Wt 292 lb 4.8 oz (132.6 kg)   BMI 40.77 kg/m    Wt Readings from Last 3 Encounters:  01/05/19 292 lb 4.8 oz (132.6 kg)  07/03/18 292 lb (132.5 kg)  03/17/18 291 lb 12.8 oz (132.4 kg)     Alert male in no acute distress. Not short of breath with conversation. Remains obese.    Labs/Other Tests and Data Reviewed:    Lab Results  Component Value Date   GLUCOSE 164 (H) 03/17/2018   NA 139 03/17/2018   K 4.8 03/17/2018   CL 100 03/17/2018   CREATININE 2.76 (H) 03/17/2018   BUN 45 (H) 03/17/2018   CO2 24 03/17/2018   HGBA1C 6.6 (A) 07/03/2018      BNP (last 3 results) No results for input(s): BNP in the last 8760 hours.  ProBNP (last 3 results) No results for  input(s): PROBNP in the last 8760 hours.    Prior CV studies:    The following studies were reviewed today:  EchoStudy Conclusions2016  - Left ventricle: The cavity size was normal. Wall thickness was increased in a pattern of mild LVH. Systolic function was normal. The estimated ejection fraction was in the range of 55% to 60%. - Left atrium: The atrium was moderately dilated. - Atrial septum: No defect or patent foramen ovale was identified.   ASSESSMENT & PLAN:    1. Permanaent AF - managed with rate control and chronic anticoagulation. His INR is checked by his PCP. No complaints of palpitations. No changes made today.   2. Chronic anticoagulation- this is managed by his PCP. No problems noted. He says last INR was 2.0  3. CAD - no active symptoms noted. Encouraged CV risk factor modification. No changes made today.    4. HLD - labs noted. Remains on statin - LDL was 79 by his labs from January noted off [facility].   5. Obesity - encouragement given.   6. DM - followed by Dr. Loanne Drilling  7. Grief - seems better.    8. CKD - better from lab noted in January - he continues to follow at [facility]  9. COVID-19 Education: The signs and symptoms of COVID-19 were discussed with the patient and how to seek care for testing (follow up with PCP or arrange E-visit).  The importance of social distancing, staying at home, hand hygiene and wearing a mask when out in public were discussed  today.  Patient Risk:   After full review of this patient's clinical status, I feel that they are at least moderate risk at this time.  Time:   Today, I have spent 10 minutes with the patient with telehealth technology discussing the above issues.     Medication Adjustments/Labs and Tests Ordered: Current medicines are reviewed at length with the patient today.  Concerns regarding medicines are outlined above.   Tests Ordered: No orders of the defined types were placed in this encounter.   Medication Changes: No orders of the defined types were placed in this encounter.   Disposition:  FU with me in 4 months.   Patient is agreeable to this plan and will call if any problems develop in the interim.   Amie Critchley, NP  01/05/2019 9:41 AM    [place]

## 2019-01-05 ENCOUNTER — Telehealth (INDEPENDENT_AMBULATORY_CARE_PROVIDER_SITE_OTHER): Payer: Medicare HMO | Admitting: Nurse Practitioner

## 2019-01-05 ENCOUNTER — Encounter: Payer: Self-pay | Admitting: Nurse Practitioner

## 2019-01-05 ENCOUNTER — Other Ambulatory Visit: Payer: Self-pay

## 2019-01-05 VITALS — BP 160/72 | Ht 71.0 in | Wt 292.3 lb

## 2019-01-05 DIAGNOSIS — Z7901 Long term (current) use of anticoagulants: Secondary | ICD-10-CM

## 2019-01-05 DIAGNOSIS — I259 Chronic ischemic heart disease, unspecified: Secondary | ICD-10-CM

## 2019-01-05 DIAGNOSIS — I4819 Other persistent atrial fibrillation: Secondary | ICD-10-CM

## 2019-01-05 DIAGNOSIS — I255 Ischemic cardiomyopathy: Secondary | ICD-10-CM

## 2019-01-05 DIAGNOSIS — Z7189 Other specified counseling: Secondary | ICD-10-CM

## 2019-01-05 NOTE — Patient Instructions (Addendum)
After Visit Summary:  We will be checking the following labs today - NONE   Medication Instructions:    Continue with your current medicines.    If you need a refill on your cardiac medications before your next appointment, please call your pharmacy.     Testing/Procedures To Be Arranged:  N/A  Follow-Up:   See me in the office in 4 months.     At [facility], you and your health needs are our priority.  As part of our continuing mission to provide you with exceptional heart care, we have created designated Provider Care Teams.  These Care Teams include your primary Cardiologist (physician) and Advanced Practice Providers (APPs -  Physician Assistants and Nurse Practitioners) who all work together to provide you with the care you need, when you need it.  Special Instructions:  . Stay safe, stay home, wash your hands for at least 20 seconds and wear a mask when out in public.  . It was good to talk with you today.    Call the [place] office at [phone] if you have any questions, problems or concerns.

## 2019-01-12 ENCOUNTER — Telehealth: Payer: Medicare HMO | Admitting: Nurse Practitioner

## 2019-01-16 DIAGNOSIS — I4891 Unspecified atrial fibrillation: Secondary | ICD-10-CM | POA: Diagnosis not present

## 2019-01-16 DIAGNOSIS — Z7901 Long term (current) use of anticoagulants: Secondary | ICD-10-CM | POA: Diagnosis not present

## 2019-02-08 ENCOUNTER — Other Ambulatory Visit: Payer: Self-pay | Admitting: Nurse Practitioner

## 2019-02-12 DIAGNOSIS — I509 Heart failure, unspecified: Secondary | ICD-10-CM | POA: Diagnosis not present

## 2019-02-12 DIAGNOSIS — E782 Mixed hyperlipidemia: Secondary | ICD-10-CM | POA: Diagnosis not present

## 2019-02-12 DIAGNOSIS — N183 Chronic kidney disease, stage 3 (moderate): Secondary | ICD-10-CM | POA: Diagnosis not present

## 2019-02-12 DIAGNOSIS — Z0000 Encounter for general adult medical examination without abnormal findings: Secondary | ICD-10-CM | POA: Diagnosis not present

## 2019-02-12 DIAGNOSIS — I872 Venous insufficiency (chronic) (peripheral): Secondary | ICD-10-CM | POA: Diagnosis not present

## 2019-02-12 DIAGNOSIS — E1122 Type 2 diabetes mellitus with diabetic chronic kidney disease: Secondary | ICD-10-CM | POA: Diagnosis not present

## 2019-02-12 DIAGNOSIS — Z1389 Encounter for screening for other disorder: Secondary | ICD-10-CM | POA: Diagnosis not present

## 2019-02-12 DIAGNOSIS — I2510 Atherosclerotic heart disease of native coronary artery without angina pectoris: Secondary | ICD-10-CM | POA: Diagnosis not present

## 2019-02-12 DIAGNOSIS — I10 Essential (primary) hypertension: Secondary | ICD-10-CM | POA: Diagnosis not present

## 2019-02-13 DIAGNOSIS — I10 Essential (primary) hypertension: Secondary | ICD-10-CM | POA: Diagnosis not present

## 2019-02-13 DIAGNOSIS — D696 Thrombocytopenia, unspecified: Secondary | ICD-10-CM | POA: Diagnosis not present

## 2019-02-13 DIAGNOSIS — E1122 Type 2 diabetes mellitus with diabetic chronic kidney disease: Secondary | ICD-10-CM | POA: Diagnosis not present

## 2019-02-13 DIAGNOSIS — M1A9XX0 Chronic gout, unspecified, without tophus (tophi): Secondary | ICD-10-CM | POA: Diagnosis not present

## 2019-02-13 DIAGNOSIS — E782 Mixed hyperlipidemia: Secondary | ICD-10-CM | POA: Diagnosis not present

## 2019-02-13 DIAGNOSIS — I4891 Unspecified atrial fibrillation: Secondary | ICD-10-CM | POA: Diagnosis not present

## 2019-02-13 DIAGNOSIS — Z125 Encounter for screening for malignant neoplasm of prostate: Secondary | ICD-10-CM | POA: Diagnosis not present

## 2019-02-13 DIAGNOSIS — Z7901 Long term (current) use of anticoagulants: Secondary | ICD-10-CM | POA: Diagnosis not present

## 2019-02-18 ENCOUNTER — Other Ambulatory Visit: Payer: Self-pay

## 2019-02-18 ENCOUNTER — Telehealth: Payer: Self-pay

## 2019-02-18 NOTE — Telephone Encounter (Signed)
Received labs from [place] [facility]. Per Dr. Loanne Drilling, pt will require NEXT AVAILABLE appt. This message routed to the front desk for scheduling purposes.

## 2019-02-18 NOTE — Telephone Encounter (Signed)
Patient has been scheduled on 02/20/19 at 9:45 a.m.

## 2019-02-20 ENCOUNTER — Encounter: Payer: Self-pay | Admitting: Endocrinology

## 2019-02-20 ENCOUNTER — Ambulatory Visit (INDEPENDENT_AMBULATORY_CARE_PROVIDER_SITE_OTHER): Payer: Medicare HMO | Admitting: Endocrinology

## 2019-02-20 ENCOUNTER — Other Ambulatory Visit: Payer: Self-pay

## 2019-02-20 VITALS — BP 122/88 | HR 82 | Ht 71.0 in | Wt 297.2 lb

## 2019-02-20 DIAGNOSIS — N183 Type 2 diabetes mellitus with diabetic chronic kidney disease: Secondary | ICD-10-CM

## 2019-02-20 DIAGNOSIS — E1122 Type 2 diabetes mellitus with diabetic chronic kidney disease: Secondary | ICD-10-CM

## 2019-02-20 DIAGNOSIS — Z794 Long term (current) use of insulin: Secondary | ICD-10-CM

## 2019-02-20 LAB — POCT GLYCOSYLATED HEMOGLOBIN (HGB A1C): Hemoglobin A1C: 7.1 % — AB (ref 4.0–5.6)

## 2019-02-20 MED ORDER — INSULIN ASPART 100 UNIT/ML ~~LOC~~ SOLN: SUBCUTANEOUS | Status: DC

## 2019-02-20 NOTE — Progress Notes (Signed)
Subjective:    Patient ID: Shane Kelly, male    DOB: 10/06/1950, 68 y.o.   MRN: [id number]  HPI Pt returns for f/u of diabetes mellitus: DM type: Insulin-requiring type 2.   Dx'ed: 8338 Complications: polyneuropathy, CAD, and renal failure.  Therapy: insulin since soon after dx DKA: never Severe hypoglycemia: never Pancreatitis: never Pancreatic imaging: never Other: he takes multiple daily injections; due to renal failure, he does not need basal insulin; fructosamine has shown similar glycemic control to A1c.  Interval history: no cbg record, but states cbg's vary from 65-100's.  It is in general highest at lunch, and lowest in the afternoon.  He says he still seldom misses the insulin.  pt states he feels well in general.  He often skips lunch (and lunch insulin).  He finished prednisone 1-2 mos ago, for rash.   Past Medical History:  Diagnosis Date  . Diabetes ([place])   . Heart attack ([place])   . Hyperlipidemia   . Hypertension   . Irregular heart beat   . Kidney disease     Past Surgical History:  Procedure Laterality Date  . LEFT KNEE REPLACEMENT    . RIGHT HIP REPLACEMENT    . Erasmo Downer  2008/2014    Social History   Socioeconomic History  . Marital status: Married    Spouse name: Not on file  . Number of children: Not on file  . Years of education: Not on file  . Highest education level: Not on file  Occupational History  . Not on file  Social Needs  . Financial resource strain: Not on file  . Food insecurity    Worry: Not on file    Inability: Not on file  . Transportation needs    Medical: Not on file    Non-medical: Not on file  Tobacco Use  . Smoking status: Never Smoker  . Smokeless tobacco: Never Used  Substance and Sexual Activity  . Alcohol use: Yes    Alcohol/week: 0.0 standard drinks  . Drug use: No  . Sexual activity: Not on file  Lifestyle  . Physical activity    Days per week: Not on file    Minutes per session: Not on file  .  Stress: Not on file  Relationships  . Social Herbalist on phone: Not on file    Gets together: Not on file    Attends religious service: Not on file    Active member of club or organization: Not on file    Attends meetings of clubs or organizations: Not on file    Relationship status: Not on file  . Intimate partner violence    Fear of current or ex partner: Not on file    Emotionally abused: Not on file    Physically abused: Not on file    Forced sexual activity: Not on file  Other Topics Concern  . Not on file  Social History Narrative  . Not on file    Current Outpatient Medications on File Prior to Visit  Medication Sig Dispense Refill  . ACCU-CHEK SOFTCLIX LANCETS lancets Use as instructed to check blood sugar twice daily 100 each 12  . allopurinol (ZYLOPRIM) 300 MG tablet Take 300 mg by mouth daily.    Marland Kitchen atorvastatin (LIPITOR) 80 MG tablet Take 80 mg by mouth daily.    Marland Kitchen buPROPion (WELLBUTRIN XL) 150 MG 24 hr tablet Take 150 mg by mouth daily.    . carvedilol (COREG) 6.25 MG  tablet Take 1 tablet (6.25 mg total) by mouth 2 (two) times daily with a meal. 180 tablet 2  . diltiazem (CARDIZEM SR) 60 MG 12 hr capsule Take 60 mg by mouth 2 (two) times daily.    Marland Kitchen docusate sodium (COLACE) 100 MG capsule Take 100 mg by mouth daily as needed for mild constipation.    Marland Kitchen ezetimibe (ZETIA) 10 MG tablet Take 10 mg by mouth daily.    . fenofibrate 54 MG tablet Take 54 mg by mouth daily.    . furosemide (LASIX) 20 MG tablet Take 20 mg by mouth.    Marland Kitchen glucose blood test strip USE TO CHECK BLOOD SUGAR TWICE DAILY 100 each 0  . HYDROcodone-acetaminophen (NORCO/VICODIN) 5-325 MG tablet Take 2 tablets by mouth at bedtime as needed for moderate pain.    . isosorbide mononitrate (IMDUR) 60 MG 24 hr tablet TAKE 1 TABLET (60 MG TOTAL) BY MOUTH DAILY. 90 tablet 2  . losartan (COZAAR) 100 MG tablet Take 100 mg by mouth daily.    . nitroGLYCERIN (NITROSTAT) 0.4 MG SL tablet Place 1 tablet  (0.4 mg total) under the tongue every 5 (five) minutes as needed for chest pain. 75 tablet 1  . warfarin (COUMADIN) 2.5 MG tablet Take 2.5 mg by mouth daily. TAKE 1 TABLET MON, WED, FRI AND A 1/2 TABLET TUES, THURS, SAT AND SUN     No current facility-administered medications on file prior to visit.     No Known Allergies  Family History  Problem Relation Age of Onset  . Diabetes Father   . Heart disease Father   . Heart disease Mother   . Cancer Neg Hx     BP 122/88 (BP Location: Left Arm, Patient Position: Sitting, Cuff Size: Large)   Pulse 82   Ht 5\' 11"  (1.803 m)   Wt 297 lb 3.2 oz (134.8 kg)   SpO2 97%   BMI 41.45 kg/m    Review of Systems He denies LOC.      Objective:   Physical Exam VITAL SIGNS:  See vs page GENERAL: no distress SKIN: no ulcer on the feet. CV: trace bilat leg edema, and bilat large vv's (R>L).   EXTEMITIES: no deformity.  feet are of normal temp, but there is severe hyperpigmentation of the legs.   There is bilateral onychomycosis of the toenails.   PULSES: dorsalis pedis intact bilat.   NEURO:  sensation is intact to touch on the feet, but decreased from normal.     Lab Results  Component Value Date   HGBA1C 7.1 (A) 02/20/2019       Assessment & Plan:  Insulin-requiring type 2 DM, with CAD: The pattern of his cbg's indicates he needs some adjustment in his therapy Renal failure: in this setting, he does not need basal insulin. Hypoglycemia: this limits aggressiveness of glycemic control.   Patient Instructions  check your blood sugar twice a day.  vary the time of day when you check, between before the 3 meals, and at bedtime.  also check if you have symptoms of your blood sugar being too high or too low.  please keep a record of the readings and bring it to your next appointment here (or you can bring the meter itself).  You can write it on any piece of paper.  please call us sooner if your blood sugar goes below 70, or if you have a lot  of readings over 200. Please change the Novolog to 3 times a  day (just before each meal) 22-8-20 units Please come back for a follow-up appointment in 3 months.

## 2019-02-20 NOTE — Patient Instructions (Signed)
check your blood sugar twice a day.  vary the time of day when you check, between before the 3 meals, and at bedtime.  also check if you have symptoms of your blood sugar being too high or too low.  please keep a record of the readings and bring it to your next appointment here (or you can bring the meter itself).  You can write it on any piece of paper.  please call us sooner if your blood sugar goes below 70, or if you have a lot of readings over 200. Please change the Novolog to 3 times a day (just before each meal) 22-8-20 units Please come back for a follow-up appointment in 3 months.

## 2019-03-11 ENCOUNTER — Other Ambulatory Visit: Payer: Self-pay

## 2019-03-11 ENCOUNTER — Other Ambulatory Visit: Payer: Self-pay | Admitting: Endocrinology

## 2019-03-11 DIAGNOSIS — Z7901 Long term (current) use of anticoagulants: Secondary | ICD-10-CM | POA: Diagnosis not present

## 2019-03-11 DIAGNOSIS — N183 Type 2 diabetes mellitus with diabetic chronic kidney disease: Secondary | ICD-10-CM

## 2019-03-11 DIAGNOSIS — Z794 Long term (current) use of insulin: Secondary | ICD-10-CM

## 2019-03-11 DIAGNOSIS — I4891 Unspecified atrial fibrillation: Secondary | ICD-10-CM | POA: Diagnosis not present

## 2019-03-11 DIAGNOSIS — E1122 Type 2 diabetes mellitus with diabetic chronic kidney disease: Secondary | ICD-10-CM

## 2019-03-11 MED ORDER — GLUCOSE BLOOD VI STRP
ORAL_STRIP | 0 refills | Status: DC
Start: 2019-03-11 — End: 2019-07-03

## 2019-03-11 NOTE — Telephone Encounter (Signed)
glucose blood test strip 100 each 0 03/11/2019    Sig: USE 1 STRIP TO CHECK GLUCOSE TWICE DAILY   Sent to pharmacy as: glucose blood test strip   E-Prescribing Status: Receipt confirmed by pharmacy (03/11/2019 2:50 PM EDT)

## 2019-03-24 DIAGNOSIS — F329 Major depressive disorder, single episode, unspecified: Secondary | ICD-10-CM | POA: Diagnosis not present

## 2019-03-24 DIAGNOSIS — N183 Chronic kidney disease, stage 3 (moderate): Secondary | ICD-10-CM | POA: Diagnosis not present

## 2019-03-24 DIAGNOSIS — Z1159 Encounter for screening for other viral diseases: Secondary | ICD-10-CM | POA: Diagnosis not present

## 2019-03-24 DIAGNOSIS — I4891 Unspecified atrial fibrillation: Secondary | ICD-10-CM | POA: Diagnosis not present

## 2019-03-24 DIAGNOSIS — E1122 Type 2 diabetes mellitus with diabetic chronic kidney disease: Secondary | ICD-10-CM | POA: Diagnosis not present

## 2019-03-24 DIAGNOSIS — I129 Hypertensive chronic kidney disease with stage 1 through stage 4 chronic kidney disease, or unspecified chronic kidney disease: Secondary | ICD-10-CM | POA: Diagnosis not present

## 2019-04-10 DIAGNOSIS — Z23 Encounter for immunization: Secondary | ICD-10-CM | POA: Diagnosis not present

## 2019-04-10 DIAGNOSIS — Z7901 Long term (current) use of anticoagulants: Secondary | ICD-10-CM | POA: Diagnosis not present

## 2019-04-30 NOTE — Progress Notes (Signed)
CARDIOLOGY OFFICE NOTE  Date:  05/04/2019    [name] Date of Birth: 1951-01-11 Medical Record [id number]  PCP:  Lois Huxley, PA  Cardiologist:  Marisa Cyphers   Chief Complaint  Patient presents with  . Follow-up    History of Present Illness: Tirso Stocks is a 68 y.o. male who presents today for a 4 month check.  Seen for Dr. Marlou Porch.   He has a history of DM, CAD, persistent AF, chronic anticoagulation, HTN, HLD, CKD and presumed ICM. He has had prior MI. He had prior coronary artery stent  2in2008 [place] has had issues with anxiety that have resulted in chest discomfort.Echo from 2016 shows normal EF.INR is followed by PCP.  Last seen by Dr. Marlou Porch in [facility] 2018- felt to be doing well.I have seen him since - he got very depressed due to his son's death - was using food as comfort. Was doing much better at our last visit here in the office back in August of 2019. PCP monitors his coumadin. I checked a BMEt at that visit - kidney function markedly abnormal - we stopped his aldactone - he was referred to [facility] as well. We did a telehealth visit back in June - overall was doing ok given the state of the pandemic.   The patient does not have symptoms concerning for COVID-19 infection (fever, chills, cough, or new shortness of breath).   Comes in today. Here alone. He is not doing well. The pandemic has been very hard for him. He is not getting out much - more isolated and just more depressed. He is asking about returning to Aldactone - thinks his BP should be better - but he had worsening CKD with this. He notes his BP is a little higher - worse when agitated but otherwise, not really that bad. He is on multiple agents. He has had his Coreg increased by Renal - needs refill. Not much salt. Occasional beer. Was eating out lots of takeout - has stopped most of this and trying to cook more  at home. Recent labs noted.   Past Medical History:  Diagnosis Date  . Diabetes ([place])   . Heart attack ([place])   . Hyperlipidemia   . Hypertension   . Irregular heart beat   . Kidney disease     Past Surgical History:  Procedure Laterality Date  . LEFT KNEE REPLACEMENT    . RIGHT HIP REPLACEMENT    . STENTS  2008/2014     Medications: Current Meds  Medication Sig  . ACCU-CHEK SOFTCLIX LANCETS lancets Use as instructed to check blood sugar twice daily  . allopurinol (ZYLOPRIM) 300 MG tablet Take 300 mg by mouth daily.  Marland Kitchen atorvastatin (LIPITOR) 80 MG tablet Take 80 mg by mouth daily.  Marland Kitchen buPROPion (WELLBUTRIN XL) 150 MG 24 hr tablet Take 150 mg by mouth daily.  . carvedilol (COREG) 12.5 MG tablet Take 1 tablet (12.5 mg total) by mouth 2 (two) times daily with a meal.  . diltiazem (CARDIZEM SR) 60 MG 12 hr capsule Take 60 mg by mouth 2 (two) times daily.  Marland Kitchen docusate sodium (COLACE) 100 MG capsule Take 100 mg by mouth daily as needed for mild constipation.  Marland Kitchen ezetimibe (ZETIA) 10 MG tablet Take 10 mg by mouth daily.  . fenofibrate 54 MG tablet Take 54 mg by mouth daily.  . furosemide (LASIX) 20 MG tablet Take 20 mg  by mouth.  Marland Kitchen glucose blood test strip USE 1 STRIP TO CHECK GLUCOSE TWICE DAILY  . HYDROcodone-acetaminophen (NORCO/VICODIN) 5-325 MG tablet Take 2 tablets by mouth at bedtime as needed for moderate pain.  Marland Kitchen insulin aspart (NOVOLOG) 100 UNIT/ML injection 3 times a day (just before each meal) 22-8-20 units, and syringes 3/day  . isosorbide mononitrate (IMDUR) 60 MG 24 hr tablet TAKE 1 TABLET (60 MG TOTAL) BY MOUTH DAILY.  Marland Kitchen losartan (COZAAR) 100 MG tablet Take 100 mg by mouth daily.  . nitroGLYCERIN (NITROSTAT) 0.4 MG SL tablet Place 1 tablet (0.4 mg total) under the tongue every 5 (five) minutes as needed for chest pain.  Marland Kitchen warfarin (COUMADIN) 2.5 MG tablet Take 2.5 mg by mouth daily. TAKE 1 TABLET MON, WED, FRI AND A 1/2 TABLET TUES, THURS, SAT AND SUN  . [DISCONTINUED]  carvedilol (COREG) 12.5 MG tablet Take 12.5 mg by mouth 2 (two) times daily with a meal.     Allergies: No Known Allergies  Social History: The patient  reports that he has never smoked. He has never used smokeless tobacco. He reports current alcohol use. He reports that he does not use drugs.   Family History: The patient's family history includes Diabetes in his father; Heart disease in his father and mother.   Review of Systems: Please see the history of present illness.   All other systems are reviewed and negative.   Physical Exam: VS:  BP 130/90 (BP Location: Left Arm, Patient Position: Sitting, Cuff Size: Large)   Pulse 73   Ht 5\' 10"  (1.778 m)   Wt 296 lb 12.8 oz (134.6 kg)   BMI 42.59 kg/m  .  BMI Body mass index is 42.59 kg/m.  Wt Readings from Last 3 Encounters:  05/04/19 296 lb 12.8 oz (134.6 kg)  02/20/19 297 lb 3.2 oz (134.8 kg)  01/05/19 292 lb 4.8 oz (132.6 kg)    General: Pleasant. Alert and in no acute distress.  His weight is up 4 pounds since my last visit. He does seem more depressed to me today.  HEENT: Normal.  Neck: Supple, no JVD, carotid bruits, or masses noted.  Cardiac: Regular rate and rhythm. No murmurs, rubs, or gallops. No edema.  Respiratory:  Lungs are clear to auscultation bilaterally with normal work of breathing.  GI: Soft and nontender.  MS: No deformity or atrophy. Gait and ROM intact.  Skin: Warm and dry. Color is normal.  Neuro:  Strength and sensation are intact and no gross focal deficits noted.  Psych: Alert, appropriate and with normal affect.   LABORATORY DATA:  EKG:  EKG is ordered today. This demonstrates AF with controlled VR of 73.  Lab Results  Component Value Date   GLUCOSE 164 (H) 03/17/2018   NA 139 03/17/2018   K 4.8 03/17/2018   CL 100 03/17/2018   CREATININE 2.76 (H) 03/17/2018   BUN 45 (H) 03/17/2018   CO2 24 03/17/2018   HGBA1C 7.1 (A) 02/20/2019       BNP (last 3 results) No results for  input(s): BNP in the last 8760 hours.  ProBNP (last 3 results) No results for input(s): PROBNP in the last 8760 hours.   Other Studies Reviewed Today:  EchoStudy Conclusions2016  - Left ventricle: The cavity size was normal. Wall thickness was increased in a pattern of mild LVH. Systolic function was normal. The estimated ejection fraction was in the range of 55% to 60%. - Left atrium: The atrium was moderately dilated. -  Atrial septum: No defect or patent foramen ovale was identified.   ASSESSMENT & PLAN:    1. Persistent AF - managed with rate control and anticoagulation with his Coumadin. INR is checked by INR - no problems noted.   2. Chronic anticoagulation - no problems noted - recent lab noted.   3. CAD - managed medically. No active symptoms.   4. CKD - no longer on Aldactone. Followed by Ren[facility].   5. HLD - labs from July noted.   6. Obesity - continues to struggle.   7. DM - per PCP  8. Grief/depression - this seems to be the crux of how he is doing at this time - we had a good talk - tried to be encouraging.   9. COVID-19 Education: The signs and symptoms of COVID-19 were discussed with the patient and how to seek care for testing (follow up with PCP or arrange E-visit).  The importance of social distancing, staying at home, hand hygiene and wearing a mask when out in public were discussed today.  Current medicines are reviewed with the patient today.  The patient does not have concerns regarding medicines other than what has been noted above.  The following changes have been made:  See above.  Labs/ tests ordered today include:    Orders Placed This Encounter  Procedures  . EKG 12-Lead     Disposition:   FU with me in 4 months.  Patient is agreeable to this plan and will call if any problems develop in the interim.   SignedTruitt Merle, NP  05/04/2019 9:40 AM  [facility] [address] [place]  [place], [place]  [address] Phone: [phone] Fax: [phone]

## 2019-05-04 ENCOUNTER — Other Ambulatory Visit: Payer: Self-pay

## 2019-05-04 ENCOUNTER — Encounter: Payer: Self-pay | Admitting: Nurse Practitioner

## 2019-05-04 ENCOUNTER — Encounter (INDEPENDENT_AMBULATORY_CARE_PROVIDER_SITE_OTHER): Payer: Self-pay

## 2019-05-04 ENCOUNTER — Ambulatory Visit: Payer: Medicare HMO | Admitting: Nurse Practitioner

## 2019-05-04 VITALS — BP 130/90 | HR 73 | Ht 70.0 in | Wt 296.8 lb

## 2019-05-04 DIAGNOSIS — Z7901 Long term (current) use of anticoagulants: Secondary | ICD-10-CM

## 2019-05-04 DIAGNOSIS — I4819 Other persistent atrial fibrillation: Secondary | ICD-10-CM | POA: Diagnosis not present

## 2019-05-04 DIAGNOSIS — I259 Chronic ischemic heart disease, unspecified: Secondary | ICD-10-CM | POA: Diagnosis not present

## 2019-05-04 DIAGNOSIS — Z7189 Other specified counseling: Secondary | ICD-10-CM

## 2019-05-04 MED ORDER — CARVEDILOL 12.5 MG PO TABS: 12.5000 mg | ORAL_TABLET | Freq: Two times a day (BID) | ORAL | 3 refills | Status: DC

## 2019-05-04 NOTE — Patient Instructions (Addendum)
After Visit Summary:  We will be checking the following labs today - NONE   Medication Instructions:    Continue with your current medicines.    If you need a refill on your cardiac medications before your next appointment, please call your pharmacy.     Testing/Procedures To Be Arranged:  N/A  Follow-Up:   See me in 4 months.     At [facility], you and your health needs are our priority.  As part of our continuing mission to provide you with exceptional heart care, we have created designated Provider Care Teams.  These Care Teams include your primary Cardiologist (physician) and Advanced Practice Providers (APPs -  Physician Assistants and Nurse Practitioners) who all work together to provide you with the care you need, when you need it.  Special Instructions:  . Stay safe, stay home, wash your hands for at least 20 seconds and wear a mask when out in public.  . It was good to talk with you today.  . Think about what we talked about today - remember "self love"   Call the [place] office at [phone] if you have any questions, problems or concerns.

## 2019-05-08 DIAGNOSIS — I4891 Unspecified atrial fibrillation: Secondary | ICD-10-CM | POA: Diagnosis not present

## 2019-05-08 DIAGNOSIS — Z7901 Long term (current) use of anticoagulants: Secondary | ICD-10-CM | POA: Diagnosis not present

## 2019-05-21 ENCOUNTER — Other Ambulatory Visit: Payer: Self-pay

## 2019-05-25 ENCOUNTER — Ambulatory Visit: Payer: Medicare HMO | Admitting: Endocrinology

## 2019-05-28 DIAGNOSIS — Z7901 Long term (current) use of anticoagulants: Secondary | ICD-10-CM | POA: Diagnosis not present

## 2019-05-28 DIAGNOSIS — D6869 Other thrombophilia: Secondary | ICD-10-CM | POA: Diagnosis not present

## 2019-05-28 DIAGNOSIS — I10 Essential (primary) hypertension: Secondary | ICD-10-CM | POA: Diagnosis not present

## 2019-05-28 DIAGNOSIS — I25119 Atherosclerotic heart disease of native coronary artery with unspecified angina pectoris: Secondary | ICD-10-CM | POA: Diagnosis not present

## 2019-05-28 DIAGNOSIS — I4891 Unspecified atrial fibrillation: Secondary | ICD-10-CM | POA: Diagnosis not present

## 2019-05-28 DIAGNOSIS — N184 Chronic kidney disease, stage 4 (severe): Secondary | ICD-10-CM | POA: Diagnosis not present

## 2019-05-28 DIAGNOSIS — I509 Heart failure, unspecified: Secondary | ICD-10-CM | POA: Diagnosis not present

## 2019-05-28 DIAGNOSIS — E782 Mixed hyperlipidemia: Secondary | ICD-10-CM | POA: Diagnosis not present

## 2019-05-28 DIAGNOSIS — E1122 Type 2 diabetes mellitus with diabetic chronic kidney disease: Secondary | ICD-10-CM | POA: Diagnosis not present

## 2019-06-10 ENCOUNTER — Other Ambulatory Visit: Payer: Self-pay

## 2019-06-12 ENCOUNTER — Other Ambulatory Visit: Payer: Self-pay

## 2019-06-12 ENCOUNTER — Encounter: Payer: Self-pay | Admitting: Endocrinology

## 2019-06-12 ENCOUNTER — Ambulatory Visit (INDEPENDENT_AMBULATORY_CARE_PROVIDER_SITE_OTHER): Payer: Medicare HMO | Admitting: Endocrinology

## 2019-06-12 VITALS — BP 100/60 | HR 83 | Ht 70.0 in | Wt 302.6 lb

## 2019-06-12 DIAGNOSIS — N1831 Chronic kidney disease, stage 3a: Secondary | ICD-10-CM | POA: Diagnosis not present

## 2019-06-12 DIAGNOSIS — E119 Type 2 diabetes mellitus without complications: Secondary | ICD-10-CM | POA: Diagnosis not present

## 2019-06-12 DIAGNOSIS — Z794 Long term (current) use of insulin: Secondary | ICD-10-CM

## 2019-06-12 DIAGNOSIS — E1121 Type 2 diabetes mellitus with diabetic nephropathy: Secondary | ICD-10-CM | POA: Diagnosis not present

## 2019-06-12 DIAGNOSIS — E1122 Type 2 diabetes mellitus with diabetic chronic kidney disease: Secondary | ICD-10-CM

## 2019-06-12 LAB — POCT GLYCOSYLATED HEMOGLOBIN (HGB A1C): Hemoglobin A1C: 6.6 % — AB (ref 4.0–5.6)

## 2019-06-12 NOTE — Patient Instructions (Addendum)
check your blood sugar twice a day.  vary the time of day when you check, between before the 3 meals, and at bedtime.  also check if you have symptoms of your blood sugar being too high or too low.  please keep a record of the readings and bring it to your next appointment here (or you can bring the meter itself).  You can write it on any piece of paper.  please call us sooner if your blood sugar goes below 70, or if you have a lot of readings over 200.   Please continue the same insulins.   Please come back for a follow-up appointment in 3 months.      

## 2019-06-12 NOTE — Progress Notes (Signed)
Subjective:    Patient ID: Shane Kelly, male    DOB: 1951/05/24, 68 y.o.   MRN: [id number]  HPI Pt returns for f/u of diabetes mellitus: DM type: Insulin-requiring type 2.   Dx'ed: [id number] Complications: polyneuropathy, CAD, and renal failure.  Therapy: insulin since soon after dx DKA: never Severe hypoglycemia: never Pancreatitis: never Pancreatic imaging: never Other: he takes multiple daily injections; due to renal failure, he does not need basal insulin; fructosamine has shown similar glycemic control to A1c; He often skips lunch (and lunch insulin).  Interval history: no cbg record, but states cbg's vary from 90-148.  He says he still seldom misses the insulin.  pt states he feels well in general.   He finished prednisone 1-2 mos ago, for rash.  Past Medical History:  Diagnosis Date  . Diabetes ([place])   . Heart attack ([place])   . Hyperlipidemia   . Hypertension   . Irregular heart beat   . Kidney disease     Past Surgical History:  Procedure Laterality Date  . LEFT KNEE REPLACEMENT    . RIGHT HIP REPLACEMENT    . Erasmo Downer  2008/2014    Social History   Socioeconomic History  . Marital status: Married    Spouse name: Not on file  . Number of children: Not on file  . Years of education: Not on file  . Highest education level: Not on file  Occupational History  . Not on file  Social Needs  . Financial resource strain: Not on file  . Food insecurity    Worry: Not on file    Inability: Not on file  . Transportation needs    Medical: Not on file    Non-medical: Not on file  Tobacco Use  . Smoking status: Never Smoker  . Smokeless tobacco: Never Used  Substance and Sexual Activity  . Alcohol use: Yes    Alcohol/week: 0.0 standard drinks  . Drug use: No  . Sexual activity: Not on file  Lifestyle  . Physical activity    Days per week: Not on file    Minutes per session: Not on file  . Stress: Not on file  Relationships  . Social Herbalist on  phone: Not on file    Gets together: Not on file    Attends religious service: Not on file    Active member of club or organization: Not on file    Attends meetings of clubs or organizations: Not on file    Relationship status: Not on file  . Intimate partner violence    Fear of current or ex partner: Not on file    Emotionally abused: Not on file    Physically abused: Not on file    Forced sexual activity: Not on file  Other Topics Concern  . Not on file  Social History Narrative  . Not on file    Current Outpatient Medications on File Prior to Visit  Medication Sig Dispense Refill  . ACCU-CHEK SOFTCLIX LANCETS lancets Use as instructed to check blood sugar twice daily 100 each 12  . allopurinol (ZYLOPRIM) 300 MG tablet Take 300 mg by mouth daily.    Marland Kitchen atorvastatin (LIPITOR) 80 MG tablet Take 80 mg by mouth daily.    Marland Kitchen buPROPion (WELLBUTRIN XL) 150 MG 24 hr tablet Take 150 mg by mouth daily.    . carvedilol (COREG) 12.5 MG tablet Take 1 tablet (12.5 mg total) by mouth 2 (two) times daily with  a meal. 180 tablet 3  . diltiazem (CARDIZEM SR) 60 MG 12 hr capsule Take 60 mg by mouth 2 (two) times daily.    Marland Kitchen docusate sodium (COLACE) 100 MG capsule Take 100 mg by mouth daily as needed for mild constipation.    Marland Kitchen ezetimibe (ZETIA) 10 MG tablet Take 10 mg by mouth daily.    . fenofibrate 54 MG tablet Take 54 mg by mouth daily.    . furosemide (LASIX) 20 MG tablet Take 20 mg by mouth.    Marland Kitchen glucose blood test strip USE 1 STRIP TO CHECK GLUCOSE TWICE DAILY 100 each 0  . HYDROcodone-acetaminophen (NORCO/VICODIN) 5-325 MG tablet Take 2 tablets by mouth at bedtime as needed for moderate pain.    Marland Kitchen insulin aspart (NOVOLOG) 100 UNIT/ML injection 3 times a day (just before each meal) 22-8-20 units, and syringes 3/day    . isosorbide mononitrate (IMDUR) 60 MG 24 hr tablet TAKE 1 TABLET (60 MG TOTAL) BY MOUTH DAILY. 90 tablet 2  . losartan (COZAAR) 100 MG tablet Take 100 mg by mouth daily.    .  nitroGLYCERIN (NITROSTAT) 0.4 MG SL tablet Place 1 tablet (0.4 mg total) under the tongue every 5 (five) minutes as needed for chest pain. 75 tablet 1  . warfarin (COUMADIN) 2.5 MG tablet Take 2.5 mg by mouth daily. TAKE 1 TABLET MON, WED, FRI AND A 1/2 TABLET TUES, THURS, SAT AND SUN     No current facility-administered medications on file prior to visit.     No Known Allergies  Family History  Problem Relation Age of Onset  . Diabetes Father   . Heart disease Father   . Heart disease Mother   . Cancer Neg Hx     BP 100/60 (BP Location: Left Arm, Patient Position: Sitting, Cuff Size: Large)   Pulse 83   Ht 5\' 10"  (1.778 m)   Wt (!) 302 lb 9.6 oz (137.3 kg)   SpO2 96%   BMI 43.42 kg/m    Review of Systems He denies hypoglycemia.      Objective:   Physical Exam VITAL SIGNS:  See vs page GENERAL: no distress SKIN: no ulcer on the feet.  CV: 2+ bilat leg edema, and bilat large vv's (R>L).   EXTEMITIES: no deformity.  feet are of normal temp, but there is severe hyperpigmentation of the legs.  There is bilateral onychomycosis of the toenails.   PULSES: dorsalis pedis absent bilat (poss due to edema).   NEURO:  sensation is intact to touch on the feet, but decreased from normal.    Lab Results  Component Value Date   HGBA1C 6.6 (A) 06/12/2019     Lab Results  Component Value Date   CREATININE 2.76 (H) 03/17/2018   BUN 45 (H) 03/17/2018   NA 139 03/17/2018   K 4.8 03/17/2018   CL 100 03/17/2018   CO2 24 03/17/2018       Assessment & Plan:  Insulin-requiring type 2 DM, with CAD: well-controlled.  Renal failure: in this setting, he does not need basal insulin.  Rash: at this time, prednisone is having just a small effect on A1c.   Patient Instructions  check your blood sugar twice a day.  vary the time of day when you check, between before the 3 meals, and at bedtime.  also check if you have symptoms of your blood sugar being too high or too low.  please keep a  record of the readings and bring it  to your next appointment here (or you can bring the meter itself).  You can write it on any piece of paper.  please call us sooner if your blood sugar goes below 70, or if you have a lot of readings over 200. Please continue the same insulins.  Please come back for a follow-up appointment in 3 months.

## 2019-06-15 ENCOUNTER — Encounter: Payer: Self-pay | Admitting: *Deleted

## 2019-06-15 DIAGNOSIS — I4891 Unspecified atrial fibrillation: Secondary | ICD-10-CM | POA: Diagnosis not present

## 2019-06-15 DIAGNOSIS — Z7901 Long term (current) use of anticoagulants: Secondary | ICD-10-CM | POA: Diagnosis not present

## 2019-07-02 ENCOUNTER — Telehealth: Payer: Self-pay | Admitting: Endocrinology

## 2019-07-02 NOTE — Telephone Encounter (Signed)
MEDICATION: glucose blood test strip for the Accuchek Aviva Plus  PHARMACY:   [place], [place] Phone:  [phone]  Fax:  [phone]      IS THIS A 90 DAY SUPPLY : 2 boxes of 50  IS PATIENT OUT OF MEDICATION: No  IF NOT; HOW MUCH IS LEFT: 1 day  LAST APPOINTMENT DATE: @11 /20/2020  NEXT APPOINTMENT DATE:@2 /23/2021  DO WE HAVE YOUR PERMISSION TO LEAVE A DETAILED MESSAGE:  Yes  OTHER COMMENTS:    **Let patient know to contact pharmacy at the end of the day to make sure medication is ready. **  ** Please notify patient to allow 48-72 hours to process**  **Encourage patient to contact the pharmacy for refills or they can request refills through [facility]**

## 2019-07-03 ENCOUNTER — Other Ambulatory Visit: Payer: Self-pay

## 2019-07-03 DIAGNOSIS — Z794 Long term (current) use of insulin: Secondary | ICD-10-CM

## 2019-07-03 DIAGNOSIS — E1122 Type 2 diabetes mellitus with diabetic chronic kidney disease: Secondary | ICD-10-CM

## 2019-07-03 DIAGNOSIS — N183 Chronic kidney disease, stage 3 unspecified: Secondary | ICD-10-CM

## 2019-07-03 MED ORDER — GLUCOSE BLOOD VI STRP: 1.0000 | ORAL_STRIP | Freq: Two times a day (BID) | 2 refills | Status: DC

## 2019-07-03 NOTE — Telephone Encounter (Signed)
glucose blood test strip 100 each 2 07/03/2019    Sig - Route: 1 each by Other route 2 (two) times daily. E11.9 - Other   Sent to pharmacy as: glucose blood test strip   E-Prescribing Status: Receipt confirmed by pharmacy (07/03/2019  7:33 AM EST)

## 2019-07-13 DIAGNOSIS — I4891 Unspecified atrial fibrillation: Secondary | ICD-10-CM | POA: Diagnosis not present

## 2019-07-13 DIAGNOSIS — Z7901 Long term (current) use of anticoagulants: Secondary | ICD-10-CM | POA: Diagnosis not present

## 2019-08-14 DIAGNOSIS — I4891 Unspecified atrial fibrillation: Secondary | ICD-10-CM | POA: Diagnosis not present

## 2019-08-14 DIAGNOSIS — Z7901 Long term (current) use of anticoagulants: Secondary | ICD-10-CM | POA: Diagnosis not present

## 2019-08-21 DIAGNOSIS — J9690 Respiratory failure, unspecified, unspecified whether with hypoxia or hypercapnia: Secondary | ICD-10-CM | POA: Diagnosis not present

## 2019-08-21 DIAGNOSIS — I248 Other forms of acute ischemic heart disease: Secondary | ICD-10-CM | POA: Diagnosis not present

## 2019-08-21 DIAGNOSIS — I4589 Other specified conduction disorders: Secondary | ICD-10-CM | POA: Diagnosis not present

## 2019-08-21 DIAGNOSIS — I5023 Acute on chronic systolic (congestive) heart failure: Secondary | ICD-10-CM | POA: Diagnosis not present

## 2019-08-21 DIAGNOSIS — R000 Tachycardia, unspecified: Secondary | ICD-10-CM | POA: Diagnosis not present

## 2019-08-21 DIAGNOSIS — I517 Cardiomegaly: Secondary | ICD-10-CM | POA: Diagnosis not present

## 2019-08-21 DIAGNOSIS — T508X5A Adverse effect of diagnostic agents, initial encounter: Secondary | ICD-10-CM | POA: Diagnosis not present

## 2019-08-21 DIAGNOSIS — I130 Hypertensive heart and chronic kidney disease with heart failure and stage 1 through stage 4 chronic kidney disease, or unspecified chronic kidney disease: Secondary | ICD-10-CM | POA: Diagnosis not present

## 2019-08-21 DIAGNOSIS — J90 Pleural effusion, not elsewhere classified: Secondary | ICD-10-CM | POA: Diagnosis not present

## 2019-08-21 DIAGNOSIS — I509 Heart failure, unspecified: Secondary | ICD-10-CM | POA: Diagnosis not present

## 2019-08-21 DIAGNOSIS — M7989 Other specified soft tissue disorders: Secondary | ICD-10-CM | POA: Diagnosis not present

## 2019-08-21 DIAGNOSIS — J9601 Acute respiratory failure with hypoxia: Secondary | ICD-10-CM | POA: Diagnosis not present

## 2019-08-21 DIAGNOSIS — Z6841 Body Mass Index (BMI) 40.0 and over, adult: Secondary | ICD-10-CM | POA: Diagnosis not present

## 2019-08-21 DIAGNOSIS — I5020 Unspecified systolic (congestive) heart failure: Secondary | ICD-10-CM | POA: Diagnosis not present

## 2019-08-21 DIAGNOSIS — R069 Unspecified abnormalities of breathing: Secondary | ICD-10-CM | POA: Diagnosis not present

## 2019-08-21 DIAGNOSIS — R918 Other nonspecific abnormal finding of lung field: Secondary | ICD-10-CM | POA: Diagnosis not present

## 2019-08-21 DIAGNOSIS — J810 Acute pulmonary edema: Secondary | ICD-10-CM | POA: Diagnosis not present

## 2019-08-21 DIAGNOSIS — R0602 Shortness of breath: Secondary | ICD-10-CM | POA: Diagnosis not present

## 2019-08-21 DIAGNOSIS — I110 Hypertensive heart disease with heart failure: Secondary | ICD-10-CM | POA: Diagnosis not present

## 2019-08-21 DIAGNOSIS — I493 Ventricular premature depolarization: Secondary | ICD-10-CM | POA: Diagnosis not present

## 2019-08-21 DIAGNOSIS — N141 Nephropathy induced by other drugs, medicaments and biological substances: Secondary | ICD-10-CM | POA: Diagnosis not present

## 2019-08-21 DIAGNOSIS — I160 Hypertensive urgency: Secondary | ICD-10-CM | POA: Diagnosis not present

## 2019-08-21 DIAGNOSIS — I4891 Unspecified atrial fibrillation: Secondary | ICD-10-CM | POA: Diagnosis not present

## 2019-08-21 DIAGNOSIS — N184 Chronic kidney disease, stage 4 (severe): Secondary | ICD-10-CM | POA: Diagnosis not present

## 2019-08-21 DIAGNOSIS — Z20822 Contact with and (suspected) exposure to covid-19: Secondary | ICD-10-CM | POA: Diagnosis not present

## 2019-08-21 DIAGNOSIS — J811 Chronic pulmonary edema: Secondary | ICD-10-CM | POA: Diagnosis not present

## 2019-08-21 DIAGNOSIS — Z7901 Long term (current) use of anticoagulants: Secondary | ICD-10-CM | POA: Diagnosis not present

## 2019-08-22 DIAGNOSIS — I509 Heart failure, unspecified: Secondary | ICD-10-CM | POA: Insufficient documentation

## 2019-08-22 DIAGNOSIS — N189 Chronic kidney disease, unspecified: Secondary | ICD-10-CM | POA: Insufficient documentation

## 2019-09-01 ENCOUNTER — Ambulatory Visit: Payer: Medicare HMO | Admitting: Nurse Practitioner

## 2019-09-03 NOTE — Progress Notes (Signed)
CARDIOLOGY OFFICE NOTE  Date:  09/08/2019    Shane Kelly Date of Birth: 1951/03/18 Medical Record [id number]  PCP:  Lois Huxley, PA  Cardiologist:  Marisa Cyphers  Chief Complaint  Patient presents with  . Follow-up    Seen for Dr. Marlou Porch    History of Present Illness: Shane Kelly is a 69 y.o. male who presents today for a follow up visit.  Seen for Dr. Marlou Porch.   He has a history of DM, CAD, persistent AF, chronic anticoagulation,HTN, HLD, CKDand presumed ICM. He has had prior MI. He had prior coronary artery stent  2in2008 as [place] He has had issues with anxiety that have resulted in chronic chest discomfort.Echo from 2016 showed normal EF.INR is followed by PCP.  Last seen by Dr. Marlou Porch in [facility] 2018- felt to be doing well.I have seen him since - he got very depressed due to his son's death - was using food as comfort. PCP monitors his coumadin. He has had worsening CKD - stopped his Aldactone - he was referred to [facility] as well.Last seen back in October - he was not doing well - the pandemic had been very hard - very depressed due to the isolation.   The patient does not have symptoms concerning for COVID-19 infection (fever, chills, cough, or new shortness of breath).   Comes in today. Here alone. He was [facility] at the last part of January - presented with acute shortness of breath and hyoxia. Lots of salty food endorsed. Treated with NTG, Lasix, BiPap - CXR with vascular congestion and pulmonary edema. CT with pneumonia. EF of 35% - positive troponin noted - concluded as "demand ischemia" - no actual chest pain.   Troponin was 29 and then 52.   He says he is feeling better until the last week of January. He had had several "chest colds" - he coughed - had green sputum at times. No fever. COVID negative during that admission. He had been using Mucinex.  Not dizzy. No chest pain. He does not recall ever actually having chest pain with his remote stents (done in [place]). His creatinine at discharge was 2.2. He is trying to do better about salt restriction. He is weighing each day. Only on Lasix 20 mg a day. Remains on CCB therapy BID. He thinks he is taking 2 20 mg Lasix twice a day - this is not clear. He thinks he has Nephrology follow up next month.   Past Medical History:  Diagnosis Date  . Diabetes ([place])   . Heart attack ([place])   . Hyperlipidemia   . Hypertension   . Irregular heart beat   . Kidney disease     Past Surgical History:  Procedure Laterality Date  . LEFT KNEE REPLACEMENT    . RIGHT HIP REPLACEMENT    . STENTS  2008/2014     Medications: Current Meds  Medication Sig  . ACCU-CHEK SOFTCLIX LANCETS lancets Use as instructed to check blood sugar twice daily  . allopurinol (ZYLOPRIM) 300 MG tablet Take 300 mg by mouth daily.  Marland Kitchen atorvastatin (LIPITOR) 80 MG tablet Take 80 mg by mouth daily.  Marland Kitchen buPROPion (WELLBUTRIN XL) 150 MG 24 hr tablet Take 150 mg by mouth daily.  Marland Kitchen docusate sodium (COLACE) 100 MG capsule Take 100 mg by mouth daily as needed for mild constipation.  Marland Kitchen ezetimibe (ZETIA) 10 MG tablet Take 10 mg by  mouth daily.  . fenofibrate 54 MG tablet Take 54 mg by mouth daily.  . furosemide (LASIX) 20 MG tablet Take 20 mg by mouth.  Marland Kitchen glucose blood test strip 1 each by Other route 2 (two) times daily. E11.9  . insulin aspart (NOVOLOG) 100 UNIT/ML injection 3 times a day (just before each meal) 22-8-20 units, and syringes 3/day  . isosorbide mononitrate (IMDUR) 60 MG 24 hr tablet TAKE 1 TABLET (60 MG TOTAL) BY MOUTH DAILY.  Marland Kitchen losartan (COZAAR) 100 MG tablet Take 100 mg by mouth daily.  . nitroGLYCERIN (NITROSTAT) 0.4 MG SL tablet Place 1 tablet (0.4 mg total) under the tongue every 5 (five) minutes as needed for chest pain.  Marland Kitchen warfarin (COUMADIN) 2.5 MG tablet Take 2.5 mg by mouth daily. TAKE 1 TABLET MON, WED, FRI  AND A 1/2 TABLET TUES, THURS, SAT AND SUN  . [DISCONTINUED] carvedilol (COREG) 12.5 MG tablet Take 1 tablet (12.5 mg total) by mouth 2 (two) times daily with a meal.  . [DISCONTINUED] diltiazem (CARDIZEM SR) 60 MG 12 hr capsule Take 60 mg by mouth 2 (two) times daily.     Allergies: No Known Allergies  Social History: The patient  reports that he has never smoked. He has never used smokeless tobacco. He reports current alcohol use. He reports that he does not use drugs.   Family History: The patient's family history includes Diabetes in his father; Heart disease in his father and mother.   Review of Systems: Please see the history of present illness.   All other systems are reviewed and negative.   Physical Exam: VS:  BP 130/68   Pulse (!) 58   Ht 5\' 10"  (1.778 m)   Wt 288 lb (130.6 kg)   SpO2 95%   BMI 41.32 kg/m  .  BMI Body mass index is 41.32 kg/m.  Wt Readings from Last 3 Encounters:  09/08/19 288 lb (130.6 kg)  06/12/19 (!) 302 lb 9.6 oz (137.3 kg)  05/04/19 296 lb 12.8 oz (134.6 kg)    General: Alert and in no acute distress. Remains morbidly obese but his weight is down.  HEENT: Normal.  Neck: Supple, no JVD, carotid bruits, or masses noted.  Cardiac: Irregular irregular rhythm. His rate is fine.  No significant edema.  Respiratory:  Lungs are fairly clear to auscultation bilaterally with normal work of breathing.  GI: Obese. Soft and nontender.  MS: No deformity or atrophy. Gait and ROM intact.  Skin: Warm and dry. Color is normal.  Neuro:  Strength and sensation are intact and no gross focal deficits noted.  Psych: Alert, appropriate and with normal affect.   LABORATORY DATA:  EKG:  EKG is not ordered today.   Lab Results  Component Value Date   GLUCOSE 164 (H) 03/17/2018   NA 139 03/17/2018   K 4.8 03/17/2018   CL 100 03/17/2018   CREATININE 2.76 (H) 03/17/2018   BUN 45 (H) 03/17/2018   CO2 24 03/17/2018   HGBA1C 6.6 (A) 06/12/2019       BNP  (last 3 results) No results for input(s): BNP in the last 8760 hours.  ProBNP (last 3 results) No results for input(s): PROBNP in the last 8760 hours.   Other Studies Reviewed Today:  ECHO FINDINGS 07/2019: LEFT VENTRICLE There is mild eccentric left ventricular hypertrophy. inferior basal akinesis, global moderate hypokinesis, EF 35%  - RIGHT VENTRICLE The right ventricle is normal in size and function.  LEFT ATRIUM The left atrium  is moderately dilated.  RIGHT ATRIUM The right atrium is mildly dilated. There is no Doppler evidence for a patent foramen ovale. - AORTIC VALVE The aortic valve is trileaflet. There is aortic valve sclerosis. There is no aortic stenosis. There is no aortic regurgitation. - MITRAL VALVE There is trivial mitral valve thickening. There is mild mitral regurgitation. - TRICUSPID VALVE Structurally normal tricuspid valve. There is mild tricuspid regurgitation. - PULMONIC VALVE The pulmonic valve is not well visualized. There is no pulmonic valvular regurgitation. There is no pulmonic valvular stenosis. - ARTERIES The aortic sinus is normal size. - - EFFUSION There is no pericardial effusion. There is no pleural effusion.   CT CHEST IMPRESSION 07/2019: 1. Cardiomegaly with pulmonary edema and small bilateral pleural effusions. Ground-glass opacities in the left lower lobe are slightly more patchy/confluent and may reflect superimposed pneumonia. 2. Increased number of small mediastinal lymph nodes are likely reactive in the setting of congestive heart failure. 3. Prominent main pulmonary artery which can be seen with pulmonary arterial hypertension. 4. Coronary artery calcifications versus stents.  Aortic Atherosclerosis (ICD10-I70.0).   Electronically Signed  By: Keith Rake M.D.  On: 08/22/2019 03:35   EchoStudy Conclusions2016  - Left ventricle: The cavity size was normal. Wall thickness was increased in a  pattern of mild LVH. Systolic function was normal. The estimated ejection fraction was in the range of 55% to 60%. - Left atrium: The atrium was moderately dilated. - Atrial septum: No defect or patent foramen ovale was identified.   ASSESSMENT & PLAN:   1. Recent admission for hypoxia, acute systolic HF and possible pneumonia - EF noted to now be 35% - he looks to be doing better clinically. Seems to understand the need for salt restriction -  Needs labs today. Will increase Coreg - stop CCB - ?if we try to proceed with cath versus 90 days of therapy - cath would be challenging given his CKD - discussed with Dr. Marlou Porch - will ask him to see Renal for their input. Will try for medical management - then repeat the echo in 90 days. Doubt he will be able to transition to [facility].   2. Elevated troponin - deemed "demand ischemia" - has known CAD - would probably need repeat cath given known CAD history and prior PCI - this would be complicated by his CKD.   3. Persistent AF - rate is ok - increasing his Coreg today - stopping CCB - remains on coumadin - will need EKG on return.   4. Chronic anticoagulation - following by PCP  5. Worsening CKD - will need nephrology input - remains on ARB therapy - doubt we could get him on Entresto - have had to stop Aldactone in the past due to worsening CKD.   6. HLD - on statin   7. Obesity  8. DM - per PCP  9. COVID-19 Education: The signs and symptoms of COVID-19 were discussed with the patient and how to seek care for testing (follow up with PCP or arrange E-visit).  The importance of social distancing, staying at home, hand hygiene and wearing a mask when out in public were discussed today.  Current medicines are reviewed with the patient today.  The patient does not have concerns regarding medicines other than what has been noted above.  The following changes have been made:  See above.  Labs/ tests ordered today include:    Orders  Placed This Encounter  Procedures  . Basic metabolic panel  .  CBC     Disposition:   FU with me in 3 to 4 weeks.   Patient is agreeable to this plan and will call if any problems develop in the interim.   SignedTruitt Merle, NP  09/08/2019 10:43 AM  [place] [address] [place], [place]  [address] Phone: [phone] Fax: [phone]

## 2019-09-08 ENCOUNTER — Ambulatory Visit: Payer: Medicare HMO | Admitting: Nurse Practitioner

## 2019-09-08 ENCOUNTER — Encounter: Payer: Self-pay | Admitting: Nurse Practitioner

## 2019-09-08 ENCOUNTER — Other Ambulatory Visit: Payer: Self-pay

## 2019-09-08 VITALS — BP 130/68 | HR 58 | Ht 70.0 in | Wt 288.0 lb

## 2019-09-08 DIAGNOSIS — I5021 Acute systolic (congestive) heart failure: Secondary | ICD-10-CM

## 2019-09-08 DIAGNOSIS — I259 Chronic ischemic heart disease, unspecified: Secondary | ICD-10-CM

## 2019-09-08 DIAGNOSIS — I4819 Other persistent atrial fibrillation: Secondary | ICD-10-CM | POA: Diagnosis not present

## 2019-09-08 DIAGNOSIS — I255 Ischemic cardiomyopathy: Secondary | ICD-10-CM

## 2019-09-08 LAB — BASIC METABOLIC PANEL
BUN/Creatinine Ratio: 21 (ref 10–24)
BUN: 60 mg/dL — ABNORMAL HIGH (ref 8–27)
CO2: 25 mmol/L (ref 20–29)
Calcium: 9.9 mg/dL (ref 8.6–10.2)
Chloride: 101 mmol/L (ref 96–106)
Creatinine, Ser: 2.89 mg/dL — ABNORMAL HIGH (ref 0.76–1.27)
GFR calc Af Amer: 25 mL/min/{1.73_m2} — ABNORMAL LOW (ref >59)
GFR calc non Af Amer: 21 mL/min/{1.73_m2} — ABNORMAL LOW (ref >59)
Glucose: 151 mg/dL — ABNORMAL HIGH (ref 65–99)
Potassium: 4.5 mmol/L (ref 3.5–5.2)
Sodium: 140 mmol/L (ref 134–144)

## 2019-09-08 LAB — CBC
Hematocrit: 47.4 % (ref 37.5–51.0)
Hemoglobin: 15.5 g/dL (ref 13.0–17.7)
MCH: 30.3 pg (ref 26.6–33.0)
MCHC: 32.7 g/dL (ref 31.5–35.7)
MCV: 93 fL (ref 79–97)
Platelets: 153 10*3/uL (ref 150–450)
RBC: 5.12 x10E6/uL (ref 4.14–5.80)
RDW: 13.2 % (ref 11.6–15.4)
WBC: 8 10*3/uL (ref 3.4–10.8)

## 2019-09-08 MED ORDER — CARVEDILOL 25 MG PO TABS: 25.0000 mg | ORAL_TABLET | Freq: Two times a day (BID) | ORAL | 3 refills | Status: DC

## 2019-09-08 NOTE — Patient Instructions (Addendum)
After Visit Summary:  We will be checking the following labs today - BMET & CBC   Medication Instructions:    Continue with your current medicines. BUT  I am stopping the Diltiazem twice a day  I am increasing the Coreg to 25 mg to take twice a day - this is at the pharmacy and I have sent to your mail order too.   We will decide about your dose of Furosemide after we see the labs   If you need a refill on your cardiac medications before your next appointment, please call your pharmacy.     Testing/Procedures To Be Arranged:  N/A  Follow-Up:   See your kidney doctor as soon as you can - to discuss need for cardiac catheterization  See me in 3 to 4 weeks. EKG on return.     At [facility], you and your health needs are our priority.  As part of our continuing mission to provide you with exceptional heart care, we have created designated Provider Care Teams.  These Care Teams include your primary Cardiologist (physician) and Advanced Practice Providers (APPs -  Physician Assistants and Nurse Practitioners) who all work together to provide you with the care you need, when you need it.  Special Instructions:  . Stay safe, stay home, wash your hands for at least 20 seconds and wear a mask when out in public.  . It was good to talk with you today.  . Salt restriction is imperative.   We are recommending the COVID 19 vaccine to ALL of our patients. Cardiac medications (including blood thinners) should not deter anyone from being vaccinated. There is no need to hold any of these medicines prior to vaccine administration.   If you are interested in the COVID 19 vaccine - call the [facility] Department at [phone] and select Option #2. Appointments can be arranged at various locations but eventually all will be moved to the [facility]. No walk-ins will be accepted.   Visit [email or web address] and clinic on the "COVID 19 Vaccine Info" for more  information.   COVID-19 Vaccine Information can also be found at: [email or web address]   For questions related to vaccine distribution or appointments, please email [email or web address] or call [phone].   A waitlist has been offered for those seeking to make an appointment - please go to [email or web address]   Call the [place] office at [phone] if you have any questions, problems or concerns.

## 2019-09-10 ENCOUNTER — Other Ambulatory Visit: Payer: Self-pay | Admitting: *Deleted

## 2019-09-10 DIAGNOSIS — N189 Chronic kidney disease, unspecified: Secondary | ICD-10-CM

## 2019-09-10 DIAGNOSIS — I10 Essential (primary) hypertension: Secondary | ICD-10-CM | POA: Insufficient documentation

## 2019-09-11 DIAGNOSIS — I4891 Unspecified atrial fibrillation: Secondary | ICD-10-CM | POA: Diagnosis not present

## 2019-09-11 DIAGNOSIS — Z7901 Long term (current) use of anticoagulants: Secondary | ICD-10-CM | POA: Diagnosis not present

## 2019-09-12 ENCOUNTER — Ambulatory Visit: Payer: Medicare HMO | Attending: Internal Medicine

## 2019-09-12 DIAGNOSIS — Z23 Encounter for immunization: Secondary | ICD-10-CM | POA: Insufficient documentation

## 2019-09-12 NOTE — Progress Notes (Signed)
   Covid-19 Vaccination Clinic  Name:  Shane Kelly    MRN: [id number] DOB: 27-Oct-1950  09/12/2019  Mr. Shane Kelly was observed post Covid-19 immunization for 15 minutes without incidence. He was provided with Vaccine Information Sheet and instruction to access the V-Safe system.   Mr. Shane Kelly was instructed to call 911 with any severe reactions post vaccine: Marland Kitchen Difficulty breathing  . Swelling of your face and throat  . A fast heartbeat  . A bad rash all over your body  . Dizziness and weakness    Immunizations Administered    Name Date Dose VIS Date Route   Pfizer COVID-19 Vaccine 09/12/2019  9:25 AM 0.3 mL 07/03/2019 Intramuscular   Manufacturer: [place]   Lot: [id number]   [place]: [id number]

## 2019-09-15 ENCOUNTER — Other Ambulatory Visit: Payer: Self-pay

## 2019-09-15 ENCOUNTER — Encounter: Payer: Self-pay | Admitting: Endocrinology

## 2019-09-15 ENCOUNTER — Ambulatory Visit: Payer: Medicare HMO | Admitting: Endocrinology

## 2019-09-15 VITALS — BP 104/74 | HR 100 | Ht 70.0 in | Wt 285.0 lb

## 2019-09-15 DIAGNOSIS — N1831 Chronic kidney disease, stage 3a: Secondary | ICD-10-CM | POA: Diagnosis not present

## 2019-09-15 DIAGNOSIS — Z794 Long term (current) use of insulin: Secondary | ICD-10-CM

## 2019-09-15 DIAGNOSIS — E1121 Type 2 diabetes mellitus with diabetic nephropathy: Secondary | ICD-10-CM

## 2019-09-15 DIAGNOSIS — E1122 Type 2 diabetes mellitus with diabetic chronic kidney disease: Secondary | ICD-10-CM

## 2019-09-15 LAB — POCT GLYCOSYLATED HEMOGLOBIN (HGB A1C): Hemoglobin A1C: 6.6 % — AB (ref 4.0–5.6)

## 2019-09-15 NOTE — Patient Instructions (Addendum)
check your blood sugar twice a day.  vary the time of day when you check, between before the 3 meals, and at bedtime.  also check if you have symptoms of your blood sugar being too high or too low.  please keep a record of the readings and bring it to your next appointment here (or you can bring the meter itself).  You can write it on any piece of paper.  please call us sooner if your blood sugar goes below 70, or if you have a lot of readings over 200.  Please continue the same insulins.  Please come back for a follow-up appointment in 4 months.   

## 2019-09-15 NOTE — Progress Notes (Signed)
Subjective:    Patient ID: Shane Kelly, male    DOB: May 26, 1951, 69 y.o.   MRN: [id number]  HPI Pt returns for f/u of diabetes mellitus: DM type: Insulin-requiring type 2.   Dx'ed: 123456 Complications: polyneuropathy, CAD, and renal failure.  Therapy: insulin since soon after dx DKA: never Severe hypoglycemia: never Pancreatitis: never Pancreatic imaging: never Other: he takes multiple daily injections; due to renal failure, he does not need basal insulin; fructosamine has shown similar glycemic control to A1c; He often skips lunch (and lunch insulin).   Interval history: no cbg record, but states cbg's vary from 98-190.  There is no trend throughout the day.  He says he seldom misses the insulin.  pt states he feels better in general, since recent hosp visit.  No recent steroids.   Past Medical History:  Diagnosis Date  . Diabetes ([place])   . Heart attack ([place])   . Hyperlipidemia   . Hypertension   . Irregular heart beat   . Kidney disease     Past Surgical History:  Procedure Laterality Date  . LEFT KNEE REPLACEMENT    . RIGHT HIP REPLACEMENT    . Erasmo Downer  2008/2014    Social History   Socioeconomic History  . Marital status: Married    Spouse name: Not on file  . Number of children: Not on file  . Years of education: Not on file  . Highest education level: Not on file  Occupational History  . Not on file  Tobacco Use  . Smoking status: Never Smoker  . Smokeless tobacco: Never Used  Substance and Sexual Activity  . Alcohol use: Yes    Alcohol/week: 0.0 standard drinks  . Drug use: No  . Sexual activity: Not on file  Other Topics Concern  . Not on file  Social History Narrative  . Not on file   Social Determinants of Health   Financial Resource Strain:   . Difficulty of Paying Living Expenses: Not on file  Food Insecurity:   . Worried About Charity fundraiser in the Last Year: Not on file  . Ran Out of Food in the Last Year: Not on file    Transportation Needs:   . Lack of Transportation (Medical): Not on file  . Lack of Transportation (Non-Medical): Not on file  Physical Activity:   . Days of Exercise per Week: Not on file  . Minutes of Exercise per Session: Not on file  Stress:   . Feeling of Stress : Not on file  Social Connections:   . Frequency of Communication with Friends and Family: Not on file  . Frequency of Social Gatherings with Friends and Family: Not on file  . Attends Religious Services: Not on file  . Active Member of Clubs or Organizations: Not on file  . Attends Archivist Meetings: Not on file  . Marital Status: Not on file  Intimate Partner Violence:   . Fear of Current or Ex-Partner: Not on file  . Emotionally Abused: Not on file  . Physically Abused: Not on file  . Sexually Abused: Not on file    Current Outpatient Medications on File Prior to Visit  Medication Sig Dispense Refill  . ACCU-CHEK SOFTCLIX LANCETS lancets Use as instructed to check blood sugar twice daily 100 each 12  . allopurinol (ZYLOPRIM) 300 MG tablet Take 300 mg by mouth daily.    Marland Kitchen atorvastatin (LIPITOR) 80 MG tablet Take 80 mg by mouth daily.    Marland Kitchen  buPROPion (WELLBUTRIN XL) 150 MG 24 hr tablet Take 150 mg by mouth daily.    . carvedilol (COREG) 25 MG tablet Take 1 tablet (25 mg total) by mouth 2 (two) times daily. 180 tablet 3  . docusate sodium (COLACE) 100 MG capsule Take 100 mg by mouth daily as needed for mild constipation.    Marland Kitchen ezetimibe (ZETIA) 10 MG tablet Take 10 mg by mouth daily.    . fenofibrate 54 MG tablet Take 54 mg by mouth daily.    . furosemide (LASIX) 20 MG tablet Take 20 mg by mouth.    Marland Kitchen glucose blood test strip 1 each by Other route 2 (two) times daily. E11.9 100 each 2  . insulin aspart (NOVOLOG) 100 UNIT/ML injection 3 times a day (just before each meal) 22-8-20 units, and syringes 3/day    . isosorbide mononitrate (IMDUR) 60 MG 24 hr tablet TAKE 1 TABLET (60 MG TOTAL) BY MOUTH DAILY. 90  tablet 2  . losartan (COZAAR) 100 MG tablet Take 100 mg by mouth daily.    . nitroGLYCERIN (NITROSTAT) 0.4 MG SL tablet Place 1 tablet (0.4 mg total) under the tongue every 5 (five) minutes as needed for chest pain. 75 tablet 1  . warfarin (COUMADIN) 2.5 MG tablet Take 2.5 mg by mouth daily. TAKE 1 TABLET MON, WED, FRI AND A 1/2 TABLET TUES, THURS, SAT AND SUN     No current facility-administered medications on file prior to visit.    No Known Allergies  Family History  Problem Relation Age of Onset  . Diabetes Father   . Heart disease Father   . Heart disease Mother   . Cancer Neg Hx     BP 104/74 (BP Location: Left Arm, Patient Position: Sitting, Cuff Size: Large)   Pulse 100   Ht 5\' 10"  (1.778 m)   Wt 285 lb (129.3 kg)   SpO2 94%   BMI 40.89 kg/m    Review of Systems He denies hypoglycemia.      Objective:   Physical Exam VITAL SIGNS:  See vs page GENERAL: no distress SKIN: no ulcer on the feet.  CV: 2+ bilat leg edema, and bilat large vv's (R>L).   EXTEMITIES: no deformity.  feet are of normal temp, but there is severe hyperpigmentation of the legs.  There is bilateral onychomycosis of the toenails.   PULSES: dorsalis pedis absent bilat (poss due to edema).   NEURO:  sensation is intact to touch on the feet, but decreased from normal.    Lab Results  Component Value Date   HGBA1C 6.6 (A) 09/15/2019   Lab Results  Component Value Date   CREATININE 2.62 (H) 09/18/2019   BUN 56 (H) 09/18/2019   NA 142 09/18/2019   K 4.8 09/18/2019   CL 102 09/18/2019   CO2 24 09/18/2019       Assessment & Plan:  Insulin-requiring type 2 DM, with CAD: well-controlled.  Renal failure: in this setting, she does not need basal insulin.   Patient Instructions  check your blood sugar twice a day.  vary the time of day when you check, between before the 3 meals, and at bedtime.  also check if you have symptoms of your blood sugar being too high or too low.  please keep a record  of the readings and bring it to your next appointment here (or you can bring the meter itself).  You can write it on any piece of paper.  please call us sooner  if your blood sugar goes below 70, or if you have a lot of readings over 200. Please continue the same insulins.  Please come back for a follow-up appointment in 4 months.

## 2019-09-17 DIAGNOSIS — I4891 Unspecified atrial fibrillation: Secondary | ICD-10-CM | POA: Diagnosis not present

## 2019-09-17 DIAGNOSIS — I129 Hypertensive chronic kidney disease with stage 1 through stage 4 chronic kidney disease, or unspecified chronic kidney disease: Secondary | ICD-10-CM | POA: Diagnosis not present

## 2019-09-17 DIAGNOSIS — F329 Major depressive disorder, single episode, unspecified: Secondary | ICD-10-CM | POA: Diagnosis not present

## 2019-09-17 DIAGNOSIS — N1832 Chronic kidney disease, stage 3b: Secondary | ICD-10-CM | POA: Diagnosis not present

## 2019-09-17 DIAGNOSIS — E1122 Type 2 diabetes mellitus with diabetic chronic kidney disease: Secondary | ICD-10-CM | POA: Diagnosis not present

## 2019-09-18 ENCOUNTER — Other Ambulatory Visit: Payer: Self-pay

## 2019-09-18 ENCOUNTER — Other Ambulatory Visit: Payer: Medicare HMO | Admitting: *Deleted

## 2019-09-18 DIAGNOSIS — N189 Chronic kidney disease, unspecified: Secondary | ICD-10-CM

## 2019-09-18 LAB — BASIC METABOLIC PANEL
BUN/Creatinine Ratio: 21 (ref 10–24)
BUN: 56 mg/dL — ABNORMAL HIGH (ref 8–27)
CO2: 24 mmol/L (ref 20–29)
Calcium: 9.5 mg/dL (ref 8.6–10.2)
Chloride: 102 mmol/L (ref 96–106)
Creatinine, Ser: 2.62 mg/dL — ABNORMAL HIGH (ref 0.76–1.27)
GFR calc Af Amer: 28 mL/min/{1.73_m2} — ABNORMAL LOW (ref >59)
GFR calc non Af Amer: 24 mL/min/{1.73_m2} — ABNORMAL LOW (ref >59)
Glucose: 124 mg/dL — ABNORMAL HIGH (ref 65–99)
Potassium: 4.8 mmol/L (ref 3.5–5.2)
Sodium: 142 mmol/L (ref 134–144)

## 2019-09-25 DIAGNOSIS — I4891 Unspecified atrial fibrillation: Secondary | ICD-10-CM | POA: Diagnosis not present

## 2019-09-25 DIAGNOSIS — Z7901 Long term (current) use of anticoagulants: Secondary | ICD-10-CM | POA: Diagnosis not present

## 2019-09-29 NOTE — Progress Notes (Signed)
CARDIOLOGY OFFICE NOTE  Date:  10/05/2019    Shane Kelly Date of Birth: [birth year] Medical Record #[id number]  PCP:  Lois Huxley, PA  Cardiologist:  Marisa Cyphers  Chief Complaint  Patient presents with  . Follow-up    Seen for Dr. Marlou Porch    History of Present Illness: Shane Kelly is a 68 y.o. male who presents today for a follow up visit. Seen for Dr. Marlou Porch.   He has a history of DM, CAD, persistent AF, chronic anticoagulation,HTN, HLD, CKDand presumed ICM. He has had prior MI. He had prior coronary artery stent  2in2008 as [place] He has had issues with anxiety that have resulted in chronic chest discomfort.Echo from 2016 showed normal EF.INR is followed by PCP.  Last seen by Dr. Marlou Porch in [facility] 2018- felt to be doing well.I have seen him since - he got very depressed due to his son's death - was using food as comfort. PCP monitors his coumadin. He has had worsening CKD - stopped his Aldactone - he was referred to [facility] as well.When seen back in October - he was not doing well - the pandemic had been very hard - very depressed due to the isolation.   He was admitted to [facility] at the last part of January - presented with acute shortness of breath and hyoxia. Lots of salty food endorsed. Treated with NTG, Lasix, BiPap - CXR with vascular congestion and pulmonary edema. CT with pneumonia. EF of 35% - positive troponin noted - concluded as "demand ischemia" - no actual chest pain. Troponin was 29 and then 52.   I then saw him last month - he was feeling better - on more Lasix - cardiac cath felt to be needed due to the reduction of his EF but given the degree of CKD - favored trying to get him medically tuned up before proceeding - he was to see Renal for discussion as well.   The patient does not have symptoms concerning for COVID-19 infection (fever, chills, cough, or new  shortness of breath).   Comes in today. Here alone. He says he is doing pretty well. He is not short of breath. No chest pain - he notes that he did have a "heavy feeling" with his prior CAD event - this has not recurred. He is restricting his salt. His weight is continuing to trend downwards. He feels pretty good. He has seen renal who he says has agreed with 90 days of guideline therapy - then repeat echo and then determine about cath or not. He is not really wanting to have a cath. He seems to be doing ok at this time. Had his second vaccine earlier this AM. He is happy with how he is doing.  Past Medical History:  Diagnosis Date  . Diabetes ([place])   . Heart attack ([place])   . Hyperlipidemia   . Hypertension   . Irregular heart beat   . Kidney disease     Past Surgical History:  Procedure Laterality Date  . LEFT KNEE REPLACEMENT    . RIGHT HIP REPLACEMENT    . STENTS  2008/2014     Medications: Current Meds  Medication Sig  . ACCU-CHEK SOFTCLIX LANCETS lancets Use as instructed to check blood sugar twice daily  . allopurinol (ZYLOPRIM) 300 MG tablet Take 300 mg by mouth daily.  Marland Kitchen atorvastatin (LIPITOR) 80 MG tablet Take 80 mg by mouth  daily.  . benzonatate (TESSALON) 100 MG capsule Take 100 mg by mouth as needed.  Marland Kitchen buPROPion (WELLBUTRIN XL) 300 MG 24 hr tablet Take 300 mg by mouth daily.  . carvedilol (COREG) 25 MG tablet Take 1 tablet (25 mg total) by mouth 2 (two) times daily.  Marland Kitchen docusate sodium (COLACE) 100 MG capsule Take 100 mg by mouth daily as needed for mild constipation.  Marland Kitchen ezetimibe (ZETIA) 10 MG tablet Take 10 mg by mouth daily.  . fenofibrate 54 MG tablet Take 54 mg by mouth daily.  . furosemide (LASIX) 20 MG tablet Take 20 mg by mouth.  Marland Kitchen glucose blood test strip 1 each by Other route 2 (two) times daily. E11.9  . insulin aspart (NOVOLOG) 100 UNIT/ML injection 3 times a day (just before each meal) 22-8-20 units, and syringes 3/day  . isosorbide mononitrate (IMDUR)  60 MG 24 hr tablet TAKE 1 TABLET (60 MG TOTAL) BY MOUTH DAILY.  Marland Kitchen losartan (COZAAR) 100 MG tablet Take 100 mg by mouth daily.  . nitroGLYCERIN (NITROSTAT) 0.4 MG SL tablet Place 1 tablet (0.4 mg total) under the tongue every 5 (five) minutes as needed for chest pain.  Marland Kitchen warfarin (COUMADIN) 2.5 MG tablet Take 2.5 mg by mouth daily. TAKE 1 TABLET MON, WED, FRI AND A 1/2 TABLET TUES, THURS, SAT AND SUN     Allergies: No Known Allergies  Social History: The patient  reports that he has never smoked. He has never used smokeless tobacco. He reports current alcohol use. He reports that he does not use drugs.   Family History: The patient's family history includes Diabetes in his father; Heart disease in his father and mother.   Review of Systems: Please see the history of present illness.   All other systems are reviewed and negative.   Physical Exam: VS:  BP 114/72   Pulse 77   Ht 5\' 10"  (1.778 m)   Wt 284 lb (128.8 kg)   SpO2 99%   BMI 40.75 kg/m  .  BMI Body mass index is 40.75 kg/m.  Wt Readings from Last 3 Encounters:  10/05/19 284 lb (128.8 kg)  09/15/19 285 lb (129.3 kg)  09/08/19 288 lb (130.6 kg)    General: Pleasant. Obese and in no acute distress.   HEENT: Normal.  Neck: Supple, no JVD, carotid bruits, or masses noted.  Cardiac: Regular rate and rhythm. No murmurs, rubs, or gallops. No edema.  Respiratory:  Lungs are clear to auscultation bilaterally with normal work of breathing.  GI: Soft and nontender.  MS: No deformity or atrophy. Gait and ROM intact.  Skin: Warm and dry. Color is normal.  Neuro:  Strength and sensation are intact and no gross focal deficits noted.  Psych: Alert, appropriate and with normal affect.   LABORATORY DATA:  EKG:  EKG is ordered today. This demonstrates persistent AF - controlled VR - septal Q's - unchanged - personally reviewed by me.   Lab Results  Component Value Date   WBC 8.0 09/08/2019   HGB 15.5 09/08/2019   HCT 47.4  09/08/2019   PLT 153 09/08/2019   GLUCOSE 124 (H) 09/18/2019   NA 142 09/18/2019   K 4.8 09/18/2019   CL 102 09/18/2019   CREATININE 2.62 (H) 09/18/2019   BUN 56 (H) 09/18/2019   CO2 24 09/18/2019   HGBA1C 6.6 (A) 09/15/2019       BNP (last 3 results) No results for input(s): BNP in the last 8760 hours.  ProBNP (  last 3 results) No results for input(s): PROBNP in the last 8760 hours.   Other Studies Reviewed Today:  ECHO FINDINGS 07/2019: LEFT VENTRICLE There is mild eccentric left ventricular hypertrophy. inferior basal akinesis, global moderate hypokinesis, EF 35%  - RIGHT VENTRICLE The right ventricle is normal in size and function.  LEFT ATRIUM The left atrium is moderately dilated.  RIGHT ATRIUM The right atrium is mildly dilated. There is no Doppler evidence for a patent foramen ovale. - AORTIC VALVE The aortic valve is trileaflet. There is aortic valve sclerosis. There is no aortic stenosis. There is no aortic regurgitation. - MITRAL VALVE There is trivial mitral valve thickening. There is mild mitral regurgitation. - TRICUSPID VALVE Structurally normal tricuspid valve. There is mild tricuspid regurgitation. - PULMONIC VALVE The pulmonic valve is not well visualized. There is no pulmonic valvular regurgitation. There is no pulmonic valvular stenosis. - ARTERIES The aortic sinus is normal size. - - EFFUSION There is no pericardial effusion. There is no pleural effusion.   CT CHEST IMPRESSION 07/2019: 1. Cardiomegaly with pulmonary edema and small bilateral pleural effusions. Ground-glass opacities in the left lower lobe are slightly more patchy/confluent and may reflect superimposed pneumonia. 2. Increased number of small mediastinal lymph nodes are likely reactive in the setting of congestive heart failure. 3. Prominent main pulmonary artery which can be seen with pulmonary arterial hypertension. 4. Coronary artery calcifications versus  stents.  Aortic Atherosclerosis (ICD10-I70.0).   Electronically Signed  By: Keith Rake M.D.  On: 08/22/2019 03:35   EchoStudy Conclusions2016  - Left ventricle: The cavity size was normal. Wall thickness was increased in a pattern of mild LVH. Systolic function was normal. The estimated ejection fraction was in the range of 55% to 60%. - Left atrium: The atrium was moderately dilated. - Atrial septum: No defect or patent foramen ovale was identified.   ASSESSMENT & PLAN:   1. Prior admission back in January for hypoxia, acute systolic HF, possible pneumonia - Echo with EF now down to 35% - doing better clinically. I have him off CCB. Increased Coreg at last visit. He remains on ARB. He has not tolerated Aldactone due to worsening CKD in the past - will continue with current regimen. He is not able to afford Entresto and does not wish to try - plan to repeat echo in early May - then decide about proceeding with cath or not. No change with current regimen today.   2. Elevated troponin with last admission - deemed "demand" ischemia - he has known CAD with remote PCI - no chest pain noted.   3. Persistent AF - rate is ok - on higher dose of Coreg - remains on anticoagulation as well.   4. Chronic anticoagulation - no problems noted.   5. Known CAD - remote MI and remote PCI - multiple risk factors - he had a "chest pain syndrome" in the past - this has not recurred.   6. CKD - has seen renal - tells me that lab there showed a little improvement - they agree with our current plan of care.   7. HLD - on statin  8. DM - per PCP  9. COVID-19 Education: The signs and symptoms of COVID-19 were discussed with the patient and how to seek care for testing (follow up with PCP or arrange E-visit).  The importance of social distancing, staying at home, hand hygiene and wearing a mask when out in public were discussed today.  Current medicines are reviewed with  the patient  today.  The patient does not have concerns regarding medicines other than what has been noted above.  The following changes have been made:  See above.  Labs/ tests ordered today include:    Orders Placed This Encounter  Procedures  . EKG 12-Lead  . ECHOCARDIOGRAM LIMITED     Disposition:   FU with me after echo in May.     Patient is agreeable to this plan and will call if any problems develop in the interim.   SignedTruitt Merle, NP  10/05/2019 11:09 AM  [facility] [address] [place], [place]  [address] Phone: [phone] Fax: [phone]

## 2019-10-01 DIAGNOSIS — I509 Heart failure, unspecified: Secondary | ICD-10-CM | POA: Diagnosis not present

## 2019-10-01 DIAGNOSIS — I10 Essential (primary) hypertension: Secondary | ICD-10-CM | POA: Diagnosis not present

## 2019-10-01 DIAGNOSIS — D6869 Other thrombophilia: Secondary | ICD-10-CM | POA: Diagnosis not present

## 2019-10-01 DIAGNOSIS — E1122 Type 2 diabetes mellitus with diabetic chronic kidney disease: Secondary | ICD-10-CM | POA: Diagnosis not present

## 2019-10-01 DIAGNOSIS — N184 Chronic kidney disease, stage 4 (severe): Secondary | ICD-10-CM | POA: Diagnosis not present

## 2019-10-01 DIAGNOSIS — Z7901 Long term (current) use of anticoagulants: Secondary | ICD-10-CM | POA: Diagnosis not present

## 2019-10-01 DIAGNOSIS — I4891 Unspecified atrial fibrillation: Secondary | ICD-10-CM | POA: Diagnosis not present

## 2019-10-01 DIAGNOSIS — E782 Mixed hyperlipidemia: Secondary | ICD-10-CM | POA: Diagnosis not present

## 2019-10-01 DIAGNOSIS — I25119 Atherosclerotic heart disease of native coronary artery with unspecified angina pectoris: Secondary | ICD-10-CM | POA: Diagnosis not present

## 2019-10-05 ENCOUNTER — Ambulatory Visit: Payer: Medicare HMO | Admitting: Nurse Practitioner

## 2019-10-05 ENCOUNTER — Other Ambulatory Visit: Payer: Self-pay

## 2019-10-05 ENCOUNTER — Encounter: Payer: Self-pay | Admitting: Nurse Practitioner

## 2019-10-05 ENCOUNTER — Ambulatory Visit: Payer: Medicare HMO | Attending: Internal Medicine

## 2019-10-05 VITALS — BP 114/72 | HR 77 | Ht 70.0 in | Wt 284.0 lb

## 2019-10-05 DIAGNOSIS — I4819 Other persistent atrial fibrillation: Secondary | ICD-10-CM | POA: Diagnosis not present

## 2019-10-05 DIAGNOSIS — I5022 Chronic systolic (congestive) heart failure: Secondary | ICD-10-CM

## 2019-10-05 DIAGNOSIS — I255 Ischemic cardiomyopathy: Secondary | ICD-10-CM | POA: Diagnosis not present

## 2019-10-05 DIAGNOSIS — Z23 Encounter for immunization: Secondary | ICD-10-CM

## 2019-10-05 DIAGNOSIS — Z7189 Other specified counseling: Secondary | ICD-10-CM | POA: Diagnosis not present

## 2019-10-05 DIAGNOSIS — I259 Chronic ischemic heart disease, unspecified: Secondary | ICD-10-CM

## 2019-10-05 DIAGNOSIS — I5021 Acute systolic (congestive) heart failure: Secondary | ICD-10-CM | POA: Diagnosis not present

## 2019-10-05 DIAGNOSIS — Z7901 Long term (current) use of anticoagulants: Secondary | ICD-10-CM

## 2019-10-05 NOTE — Progress Notes (Signed)
   Covid-19 Vaccination Clinic  Name:  Maico Mulvehill    MRN: [id number] DOB: [birth year]  10/05/2019  Mr. Prichett was observed post Covid-19 immunization for 15 minutes without incident. He was provided with Vaccine Information Sheet and instruction to access the V-Safe system.   Mr. Servantes was instructed to call 911 with any severe reactions post vaccine: Marland Kitchen Difficulty breathing  . Swelling of face and throat  . A fast heartbeat  . A bad rash all over body  . Dizziness and weakness   Immunizations Administered    Name Date Dose VIS Date Route   Pfizer COVID-19 Vaccine 10/05/2019  8:25 AM 0.3 mL 07/03/2019 Intramuscular   Manufacturer: [place]   Lot: [id number]   [place]: [id number]

## 2019-10-05 NOTE — Patient Instructions (Addendum)
After Visit Summary:  We will be checking the following labs today - NONE   Medication Instructions:    Continue with your current medicines.    If you need a refill on your cardiac medications before your next appointment, please call your pharmacy.     Testing/Procedures To Be Arranged:  Limited echo after May 10th  Follow-Up:   See me a few days after the echo.     At [facility], you and your health needs are our priority.  As part of our continuing mission to provide you with exceptional heart care, we have created designated Provider Care Teams.  These Care Teams include your primary Cardiologist (physician) and Advanced Practice Providers (APPs -  Physician Assistants and Nurse Practitioners) who all work together to provide you with the care you need, when you need it.  Special Instructions:  . Stay safe, stay home, wash your hands for at least 20 seconds and wear a mask when out in public.  . It was good to talk with you today.    Call the [place] office at [phone] if you have any questions, problems or concerns.

## 2019-10-06 DIAGNOSIS — E782 Mixed hyperlipidemia: Secondary | ICD-10-CM | POA: Diagnosis not present

## 2019-10-06 DIAGNOSIS — I25119 Atherosclerotic heart disease of native coronary artery with unspecified angina pectoris: Secondary | ICD-10-CM | POA: Diagnosis not present

## 2019-10-06 DIAGNOSIS — I10 Essential (primary) hypertension: Secondary | ICD-10-CM | POA: Diagnosis not present

## 2019-10-06 DIAGNOSIS — F329 Major depressive disorder, single episode, unspecified: Secondary | ICD-10-CM | POA: Diagnosis not present

## 2019-10-06 DIAGNOSIS — N184 Chronic kidney disease, stage 4 (severe): Secondary | ICD-10-CM | POA: Diagnosis not present

## 2019-10-06 DIAGNOSIS — I2510 Atherosclerotic heart disease of native coronary artery without angina pectoris: Secondary | ICD-10-CM | POA: Diagnosis not present

## 2019-10-06 DIAGNOSIS — E1165 Type 2 diabetes mellitus with hyperglycemia: Secondary | ICD-10-CM | POA: Diagnosis not present

## 2019-10-06 DIAGNOSIS — I4891 Unspecified atrial fibrillation: Secondary | ICD-10-CM | POA: Diagnosis not present

## 2019-10-06 DIAGNOSIS — E1122 Type 2 diabetes mellitus with diabetic chronic kidney disease: Secondary | ICD-10-CM | POA: Diagnosis not present

## 2019-10-15 DIAGNOSIS — Z7901 Long term (current) use of anticoagulants: Secondary | ICD-10-CM | POA: Diagnosis not present

## 2019-10-15 DIAGNOSIS — I4891 Unspecified atrial fibrillation: Secondary | ICD-10-CM | POA: Diagnosis not present

## 2019-10-21 DIAGNOSIS — I10 Essential (primary) hypertension: Secondary | ICD-10-CM | POA: Diagnosis not present

## 2019-10-27 DIAGNOSIS — I4891 Unspecified atrial fibrillation: Secondary | ICD-10-CM | POA: Diagnosis not present

## 2019-10-27 DIAGNOSIS — Z7901 Long term (current) use of anticoagulants: Secondary | ICD-10-CM | POA: Diagnosis not present

## 2019-11-03 DIAGNOSIS — Z7901 Long term (current) use of anticoagulants: Secondary | ICD-10-CM | POA: Diagnosis not present

## 2019-11-03 DIAGNOSIS — I4891 Unspecified atrial fibrillation: Secondary | ICD-10-CM | POA: Diagnosis not present

## 2019-11-17 DIAGNOSIS — I4891 Unspecified atrial fibrillation: Secondary | ICD-10-CM | POA: Diagnosis not present

## 2019-11-17 DIAGNOSIS — Z7901 Long term (current) use of anticoagulants: Secondary | ICD-10-CM | POA: Diagnosis not present

## 2019-12-01 ENCOUNTER — Ambulatory Visit (HOSPITAL_COMMUNITY): Payer: Medicare HMO | Attending: Cardiovascular Disease

## 2019-12-01 ENCOUNTER — Other Ambulatory Visit: Payer: Self-pay

## 2019-12-01 DIAGNOSIS — I255 Ischemic cardiomyopathy: Secondary | ICD-10-CM | POA: Diagnosis not present

## 2019-12-01 DIAGNOSIS — I5022 Chronic systolic (congestive) heart failure: Secondary | ICD-10-CM | POA: Diagnosis not present

## 2019-12-02 NOTE — Progress Notes (Signed)
CARDIOLOGY OFFICE NOTE  Date:  12/08/2019    Shane Kelly Date of Birth: 08/30/1950 Medical Record #[id number]  PCP:  Lois Huxley, PA  Cardiologist:  Marisa Cyphers   Chief Complaint  Patient presents with  . Follow-up    History of Present Illness: Shane Kelly is a 69 y.o. male who presents today for a post echo visit. Seen for Dr. Marlou Porch.   He has a history of DM, CAD, persistent AF, chronic anticoagulation,HTN, HLD, CKDand presumed ICM. He has had prior MI. He had prior coronary artery stent  2in2008 as [place] He has had issues with anxiety that have resulted inchronicchest discomfort.Echo from 2016 showednormal EF.INR is followed by PCP.  Last seen by Dr. Marlou Porch in [facility] 2018- felt to be doing well.I have seen him since - he got very depressed due to his son's death - was using food as comfort. PCP monitors his coumadin.He has had worsening CKD - stopped his Aldactone -he was referred to [facility] as well.When seen back in October - he was not doing well - the pandemic had been very hard - very depressed due to the isolation.  He was admitted to [facility]) at the [place] January - presented with acute shortness of breath and hyoxia. Lots of salty food endorsed. Treated with NTG, Lasix, BiPap - CXR with vascular congestion and pulmonary edema.CT with pneumonia.EF of 35% - positive troponin noted - concluded as"demand ischemia"- no actual chest pain. Troponin was 29 and then 52.   I then saw him in follow up - he was feeling better - on more Lasix - cardiac cath felt to be needed due to the reduction of his EF but given the degree of CKD - favored trying to get him medically tuned up before proceeding - he was to see Renal for discussion as well. Last seen in march - doing ok. Had had a "heavy feeling" with his prior CAD event - this has not recurred. He was  restricting his salt. His weight was continuing to trend downwards.  He had seen renal who he says had agreed with 90 days of guideline therapy - then repeat echo and then determine about cath or not. He was not really wanting to have a cath.   The patient does not have symptoms concerning for COVID-19 infection (fever, chills, cough, or new shortness of breath).   Comes in today. Here alone. He says he feels pretty good. He did hurt his back and that sidetracked him for a while but this is now better. He says he actually feel the best he has in several months. No chest pain. Breathing is ok. Swelling is at his baseline. Weight is down a few pounds. Not dizzy or lightheaded. Seeing Renal later this month. Remains on ARB. No problems with his coumadin. He is pretty happy with how he is doing overall.   Past Medical History:  Diagnosis Date  . Diabetes ([place])   . Heart attack ([place])   . Hyperlipidemia   . Hypertension   . Irregular heart beat   . Kidney disease     Past Surgical History:  Procedure Laterality Date  . LEFT KNEE REPLACEMENT    . RIGHT HIP REPLACEMENT    . STENTS  2008/2014     Medications: Current Meds  Medication Sig  . ACCU-CHEK SOFTCLIX LANCETS lancets Use as instructed to check blood sugar twice daily  . allopurinol (ZYLOPRIM)  300 MG tablet Take 300 mg by mouth daily.  Marland Kitchen atorvastatin (LIPITOR) 80 MG tablet Take 80 mg by mouth daily.  . benzonatate (TESSALON) 100 MG capsule Take 100 mg by mouth as needed.  Marland Kitchen buPROPion (WELLBUTRIN SR) 150 MG 12 hr tablet   . buPROPion (WELLBUTRIN XL) 300 MG 24 hr tablet Take 300 mg by mouth daily.  Marland Kitchen docusate sodium (COLACE) 100 MG capsule Take 100 mg by mouth daily as needed for mild constipation.  Marland Kitchen ezetimibe (ZETIA) 10 MG tablet Take 10 mg by mouth daily.  . fenofibrate 54 MG tablet Take 54 mg by mouth daily.  . furosemide (LASIX) 20 MG tablet Take 20 mg by mouth.  Marland Kitchen glucose blood test strip 1 each by Other route 2 (two) times  daily. E11.9  . HYDROcodone-acetaminophen (NORCO/VICODIN) 5-325 MG tablet   . insulin aspart (NOVOLOG) 100 UNIT/ML injection 3 times a day (just before each meal) 22-8-20 units, and syringes 3/day  . isosorbide mononitrate (IMDUR) 60 MG 24 hr tablet TAKE 1 TABLET (60 MG TOTAL) BY MOUTH DAILY.  Marland Kitchen losartan (COZAAR) 100 MG tablet Take 100 mg by mouth daily.  . nitroGLYCERIN (NITROSTAT) 0.4 MG SL tablet Place 1 tablet (0.4 mg total) under the tongue every 5 (five) minutes as needed for chest pain.  Marland Kitchen warfarin (COUMADIN) 2.5 MG tablet Take 2.5 mg by mouth daily. TAKE 1 TABLET MON, WED, FRI AND A 1/2 TABLET TUES, THURS, SAT AND SUN     Allergies: No Known Allergies  Social History: The patient  reports that he has never smoked. He has never used smokeless tobacco. He reports current alcohol use. He reports that he does not use drugs.   Family History: The patient's family history includes Diabetes in his father; Heart disease in his father and mother.   Review of Systems: Please see the history of present illness.   All other systems are reviewed and negative.   Physical Exam: VS:  BP 100/72   Pulse 65   Ht 5\' 10"  (1.778 m)   Wt 279 lb 6.4 oz (126.7 kg)   SpO2 97%   BMI 40.09 kg/m  .  BMI Body mass index is 40.09 kg/m.  Wt Readings from Last 3 Encounters:  12/08/19 279 lb 6.4 oz (126.7 kg)  10/05/19 284 lb (128.8 kg)  09/15/19 285 lb (129.3 kg)    General: Pleasant. Alert and in no acute distress.  His weight is down here today.  HEENT: Normal.  Neck: Supple, no JVD, carotid bruits, or masses noted.  Cardiac: Regular rate and rhythm. No murmurs, rubs, or gallops. No edema.  Respiratory:  Lungs are clear to auscultation bilaterally with normal work of breathing.  GI: Soft and nontender.  MS: No deformity or atrophy. Gait and ROM intact.  Skin: Warm and dry. Color is normal.  Neuro:  Strength and sensation are intact and no gross focal deficits noted.  Psych: Alert, appropriate  and with normal affect.   LABORATORY DATA:  EKG:  EKG is not ordered today.    Lab Results  Component Value Date   WBC 8.0 09/08/2019   HGB 15.5 09/08/2019   HCT 47.4 09/08/2019   PLT 153 09/08/2019   GLUCOSE 124 (H) 09/18/2019   NA 142 09/18/2019   K 4.8 09/18/2019   CL 102 09/18/2019   CREATININE 2.62 (H) 09/18/2019   BUN 56 (H) 09/18/2019   CO2 24 09/18/2019   HGBA1C 6.6 (A) 09/15/2019      BNP (  last 3 results) No results for input(s): BNP in the last 8760 hours.  ProBNP (last 3 results) No results for input(s): PROBNP in the last 8760 hours.   Other Studies Reviewed Today:  ECHO IMPRESSIONS 11/2019  1. Left ventricular ejection fraction, by estimation, is 35 to 40%. The  left ventricle has moderately decreased function. The left ventricle  demonstrates regional wall motion abnormalities (see scoring  diagram/findings for description). There is mild  concentric left ventricular hypertrophy. Left ventricular diastolic  function could not be evaluated.  2. Right ventricular systolic function is mildly reduced. The right  ventricular size is normal. There is moderately elevated pulmonary artery  systolic pressure.  3. Left atrial size was severely dilated.  4. Right atrial size was mildly dilated.  5. The mitral valve is grossly normal. Mild to moderate mitral valve  regurgitation. No evidence of mitral stenosis.  6. The aortic valve is grossly normal. Aortic valve regurgitation is not  visualized. No aortic stenosis is present.    ECHOFINDINGS1/2021: LEFT VENTRICLE There is mild eccentric left ventricular hypertrophy. inferior basal akinesis, global moderate hypokinesis, EF 35%  - RIGHT VENTRICLE The right ventricle is normal in size and function.  LEFT ATRIUM The left atrium is moderately dilated.  RIGHT ATRIUM The right atrium is mildly dilated. There is no Doppler evidence for a patent foramen ovale. - AORTIC VALVE The aortic valve is  trileaflet. There is aortic valve sclerosis. There is no aortic stenosis. There is no aortic regurgitation. - MITRAL VALVE There is trivial mitral valve thickening. There is mild mitral regurgitation. - TRICUSPID VALVE Structurally normal tricuspid valve. There is mild tricuspid regurgitation. - PULMONIC VALVE The pulmonic valve is not well visualized. There is no pulmonic valvular regurgitation. There is no pulmonic valvular stenosis. - ARTERIES The aortic sinus is normal size. - - EFFUSION There is no pericardial effusion. There is no pleural effusion.   CT CHESTIMPRESSION1/2021: 1. Cardiomegaly with pulmonary edema and small bilateral pleural effusions. Ground-glass opacities in the left lower lobe are slightly more patchy/confluent and may reflect superimposed pneumonia. 2. Increased number of small mediastinal lymph nodes are likely reactive in the setting of congestive heart failure. 3. Prominent main pulmonary artery which can be seen with pulmonary arterial hypertension. 4. Coronary artery calcifications versus stents.  Aortic Atherosclerosis (ICD10-I70.0).   Electronically Signed By: Keith Rake M.D. On: 08/22/2019 03:35   EchoStudy Conclusions2016  - Left ventricle: The cavity size was normal. Wall thickness was increased in a pattern of mild LVH. Systolic function was normal. The estimated ejection fraction was in the range of 55% to 60%. - Left atrium: The atrium was moderately dilated. - Atrial septum: No defect or patent foramen ovale was identified.   ASSESSMENT & PLAN:   1. Chronic systolic HF - he was admitted back in January to [facility] with hypoxia and acute systolic HF/possible pneumonia - EF was down to 35% - had been normal several years prior. He is on beta blocker and ARB and nitrate therapy. Repeat limited echo with EF of 35 to 40% - may be little better/unchanged - he is feeling good clinically - we have discussed  option of proceeding with cardiac cath that may result in worsening CKD - he is doing well on medical therapy and opts to continue this for now. Not able to afford Entresto and does not wish to try - suspect his CKD would be limiting factor as well.   2. Known CAD - he did have elevated troponin  with that admission in January - deemed "demand" ischemia - no chest pain noted - on chronic nitrate therapy - would favor continued medical therapy.   3. CKD - rechecking today - may need to consider changing ARB over to Hydralazine.   4. Persistent AF - his rate is controlled with Coreg.   5. Chronic anticoagulation with Coumadin - no problems noted.   6. HLD - on stain therapy  7. DM - per PCP  8. COVID-19 Education: The signs and symptoms of COVID-19 were discussed with the patient and how to seek care for testing (follow up with PCP or arrange E-visit).  The importance of social distancing, staying at home, hand hygiene and wearing a mask when out in public were discussed today. He has been vaccinated.   Current medicines are reviewed with the patient today.  The patient does not have concerns regarding medicines other than what has been noted above.  The following changes have been made:  See above.  Labs/ tests ordered today include:    Orders Placed This Encounter  Procedures  . Basic metabolic panel  . CBC     Disposition:   FU with me in 3 to 4 months - we have elected to continue with medical therapy.   Patient is agreeable to this plan and will call if any problems develop in the interim.   SignedTruitt Merle, NP  12/08/2019 10:14 AM  [place] [address] [place]  [address] Phone: [phone] Fax: [phone]

## 2019-12-08 ENCOUNTER — Ambulatory Visit: Payer: Medicare HMO | Admitting: Nurse Practitioner

## 2019-12-08 ENCOUNTER — Encounter: Payer: Self-pay | Admitting: Nurse Practitioner

## 2019-12-08 ENCOUNTER — Other Ambulatory Visit: Payer: Self-pay

## 2019-12-08 VITALS — BP 100/72 | HR 65 | Ht 70.0 in | Wt 279.4 lb

## 2019-12-08 DIAGNOSIS — I255 Ischemic cardiomyopathy: Secondary | ICD-10-CM | POA: Diagnosis not present

## 2019-12-08 DIAGNOSIS — N189 Chronic kidney disease, unspecified: Secondary | ICD-10-CM

## 2019-12-08 DIAGNOSIS — I4819 Other persistent atrial fibrillation: Secondary | ICD-10-CM

## 2019-12-08 DIAGNOSIS — I259 Chronic ischemic heart disease, unspecified: Secondary | ICD-10-CM | POA: Diagnosis not present

## 2019-12-08 DIAGNOSIS — I5022 Chronic systolic (congestive) heart failure: Secondary | ICD-10-CM

## 2019-12-08 DIAGNOSIS — Z7189 Other specified counseling: Secondary | ICD-10-CM | POA: Diagnosis not present

## 2019-12-08 DIAGNOSIS — Z7901 Long term (current) use of anticoagulants: Secondary | ICD-10-CM

## 2019-12-08 LAB — CBC
Hematocrit: 45 % (ref 37.5–51.0)
Hemoglobin: 14.7 g/dL (ref 13.0–17.7)
MCH: 30.2 pg (ref 26.6–33.0)
MCHC: 32.7 g/dL (ref 31.5–35.7)
MCV: 92 fL (ref 79–97)
Platelets: 128 10*3/uL — ABNORMAL LOW (ref 150–450)
RBC: 4.87 x10E6/uL (ref 4.14–5.80)
RDW: 14.6 % (ref 11.6–15.4)
WBC: 6.4 10*3/uL (ref 3.4–10.8)

## 2019-12-08 LAB — BASIC METABOLIC PANEL
BUN/Creatinine Ratio: 19 (ref 10–24)
BUN: 40 mg/dL — ABNORMAL HIGH (ref 8–27)
CO2: 24 mmol/L (ref 20–29)
Calcium: 9.6 mg/dL (ref 8.6–10.2)
Chloride: 105 mmol/L (ref 96–106)
Creatinine, Ser: 2.12 mg/dL — ABNORMAL HIGH (ref 0.76–1.27)
GFR calc Af Amer: 36 mL/min/{1.73_m2} — ABNORMAL LOW (ref >59)
GFR calc non Af Amer: 31 mL/min/{1.73_m2} — ABNORMAL LOW (ref >59)
Glucose: 135 mg/dL — ABNORMAL HIGH (ref 65–99)
Potassium: 4.4 mmol/L (ref 3.5–5.2)
Sodium: 142 mmol/L (ref 134–144)

## 2019-12-08 NOTE — Patient Instructions (Addendum)
After Visit Summary:  We will be checking the following labs today - BMET and CBC today.   Medication Instructions:    Continue with your current medicines.    If you need a refill on your cardiac medications before your next appointment, please call your pharmacy.     Testing/Procedures To Be Arranged:  N/A  Follow-Up:   See me in about 3 to 4 months    At [facility], you and your health needs are our priority.  As part of our continuing mission to provide you with exceptional heart care, we have created designated Provider Care Teams.  These Care Teams include your primary Cardiologist (physician) and Advanced Practice Providers (APPs -  Physician Assistants and Nurse Practitioners) who all work together to provide you with the care you need, when you need it.  Special Instructions:  . Stay safe, stay home, wash your hands for at least 20 seconds and wear a mask when out in public.  . It was good to talk with you today.    Call the [place] office at [phone] if you have any questions, problems or concerns.

## 2019-12-16 DIAGNOSIS — I4891 Unspecified atrial fibrillation: Secondary | ICD-10-CM | POA: Diagnosis not present

## 2019-12-16 DIAGNOSIS — N1832 Chronic kidney disease, stage 3b: Secondary | ICD-10-CM | POA: Diagnosis not present

## 2019-12-16 DIAGNOSIS — I129 Hypertensive chronic kidney disease with stage 1 through stage 4 chronic kidney disease, or unspecified chronic kidney disease: Secondary | ICD-10-CM | POA: Diagnosis not present

## 2019-12-16 DIAGNOSIS — E1122 Type 2 diabetes mellitus with diabetic chronic kidney disease: Secondary | ICD-10-CM | POA: Diagnosis not present

## 2019-12-16 DIAGNOSIS — J019 Acute sinusitis, unspecified: Secondary | ICD-10-CM | POA: Diagnosis not present

## 2019-12-17 DIAGNOSIS — H35372 Puckering of macula, left eye: Secondary | ICD-10-CM | POA: Diagnosis not present

## 2019-12-17 DIAGNOSIS — H43813 Vitreous degeneration, bilateral: Secondary | ICD-10-CM | POA: Diagnosis not present

## 2019-12-17 DIAGNOSIS — E119 Type 2 diabetes mellitus without complications: Secondary | ICD-10-CM | POA: Diagnosis not present

## 2019-12-17 DIAGNOSIS — H524 Presbyopia: Secondary | ICD-10-CM | POA: Diagnosis not present

## 2019-12-17 DIAGNOSIS — H2513 Age-related nuclear cataract, bilateral: Secondary | ICD-10-CM | POA: Diagnosis not present

## 2019-12-17 DIAGNOSIS — I10 Essential (primary) hypertension: Secondary | ICD-10-CM | POA: Diagnosis not present

## 2019-12-23 DIAGNOSIS — Z7901 Long term (current) use of anticoagulants: Secondary | ICD-10-CM | POA: Diagnosis not present

## 2019-12-23 DIAGNOSIS — I4891 Unspecified atrial fibrillation: Secondary | ICD-10-CM | POA: Diagnosis not present

## 2019-12-31 DIAGNOSIS — I25119 Atherosclerotic heart disease of native coronary artery with unspecified angina pectoris: Secondary | ICD-10-CM | POA: Diagnosis not present

## 2019-12-31 DIAGNOSIS — G894 Chronic pain syndrome: Secondary | ICD-10-CM | POA: Diagnosis not present

## 2019-12-31 DIAGNOSIS — R21 Rash and other nonspecific skin eruption: Secondary | ICD-10-CM | POA: Diagnosis not present

## 2019-12-31 DIAGNOSIS — E1122 Type 2 diabetes mellitus with diabetic chronic kidney disease: Secondary | ICD-10-CM | POA: Diagnosis not present

## 2019-12-31 DIAGNOSIS — N184 Chronic kidney disease, stage 4 (severe): Secondary | ICD-10-CM | POA: Diagnosis not present

## 2019-12-31 DIAGNOSIS — I509 Heart failure, unspecified: Secondary | ICD-10-CM | POA: Diagnosis not present

## 2019-12-31 DIAGNOSIS — D6869 Other thrombophilia: Secondary | ICD-10-CM | POA: Diagnosis not present

## 2019-12-31 DIAGNOSIS — E782 Mixed hyperlipidemia: Secondary | ICD-10-CM | POA: Diagnosis not present

## 2019-12-31 DIAGNOSIS — I872 Venous insufficiency (chronic) (peripheral): Secondary | ICD-10-CM | POA: Diagnosis not present

## 2019-12-31 DIAGNOSIS — I4891 Unspecified atrial fibrillation: Secondary | ICD-10-CM | POA: Diagnosis not present

## 2019-12-31 DIAGNOSIS — I10 Essential (primary) hypertension: Secondary | ICD-10-CM | POA: Diagnosis not present

## 2020-01-04 DIAGNOSIS — H348122 Central retinal vein occlusion, left eye, stable: Secondary | ICD-10-CM | POA: Diagnosis not present

## 2020-01-04 DIAGNOSIS — H35372 Puckering of macula, left eye: Secondary | ICD-10-CM | POA: Diagnosis not present

## 2020-01-04 DIAGNOSIS — H35033 Hypertensive retinopathy, bilateral: Secondary | ICD-10-CM | POA: Diagnosis not present

## 2020-01-04 DIAGNOSIS — H43813 Vitreous degeneration, bilateral: Secondary | ICD-10-CM | POA: Diagnosis not present

## 2020-01-13 ENCOUNTER — Other Ambulatory Visit: Payer: Self-pay

## 2020-01-13 ENCOUNTER — Encounter: Payer: Self-pay | Admitting: Endocrinology

## 2020-01-13 ENCOUNTER — Ambulatory Visit: Payer: Medicare HMO | Admitting: Endocrinology

## 2020-01-13 VITALS — BP 112/76 | HR 81 | Ht 70.0 in | Wt 275.0 lb

## 2020-01-13 DIAGNOSIS — E1121 Type 2 diabetes mellitus with diabetic nephropathy: Secondary | ICD-10-CM | POA: Diagnosis not present

## 2020-01-13 DIAGNOSIS — E1122 Type 2 diabetes mellitus with diabetic chronic kidney disease: Secondary | ICD-10-CM | POA: Diagnosis not present

## 2020-01-13 DIAGNOSIS — N183 Type 2 diabetes mellitus with diabetic chronic kidney disease: Secondary | ICD-10-CM

## 2020-01-13 DIAGNOSIS — Z794 Long term (current) use of insulin: Secondary | ICD-10-CM

## 2020-01-13 DIAGNOSIS — N1831 Chronic kidney disease, stage 3a: Secondary | ICD-10-CM

## 2020-01-13 LAB — POCT GLYCOSYLATED HEMOGLOBIN (HGB A1C): Hemoglobin A1C: 6.2 % — AB (ref 4.0–5.6)

## 2020-01-13 MED ORDER — GLUCOSE BLOOD VI STRP: 1.0000 | ORAL_STRIP | Freq: Two times a day (BID) | 3 refills | Status: DC

## 2020-01-13 MED ORDER — INSULIN ASPART 100 UNIT/ML ~~LOC~~ SOLN: SUBCUTANEOUS | 11 refills | Status: DC

## 2020-01-13 NOTE — Patient Instructions (Addendum)
check your blood sugar twice a day.  vary the time of day when you check, between before the 3 meals, and at bedtime.  also check if you have symptoms of your blood sugar being too high or too low.  please keep a record of the readings and bring it to your next appointment here (or you can bring the meter itself).  You can write it on any piece of paper.  please call us sooner if your blood sugar goes below 70, or if you have a lot of readings over 200. Please reduce the breakfast Novolog to 16 units.  Please come back for a follow-up appointment in 4 months.

## 2020-01-13 NOTE — Progress Notes (Signed)
Subjective:    Patient ID: Shane Kelly, male    DOB: 1951-05-21, 69 y.o.   MRN: [id number]  HPI Pt returns for f/u of diabetes mellitus: DM type: Insulin-requiring type 2.   Dx'ed: 9675 Complications: PN, CAD, and stage 4 CRI.  Therapy: insulin since soon after dx DKA: never Severe hypoglycemia: never Pancreatitis: never Pancreatic imaging: never Other: he takes multiple daily injections; due to renal failure, he does not need basal insulin; fructosamine has shown similar glycemic control to A1c; He often skips lunch (and lunch insulin).   Interval history: no cbg record, but states cbg's vary from 80-200.  There is no trend throughout the day.  No recent steroids. He has mild hypoglycemia after breakfast, even if he takes just 20 units then.   Past Medical History:  Diagnosis Date  . Diabetes ([place])   . Heart attack ([place])   . Hyperlipidemia   . Hypertension   . Irregular heart beat   . Kidney disease     Past Surgical History:  Procedure Laterality Date  . LEFT KNEE REPLACEMENT    . RIGHT HIP REPLACEMENT    . Erasmo Downer  2008/2014    Social History   Socioeconomic History  . Marital status: Married    Spouse name: Not on file  . Number of children: Not on file  . Years of education: Not on file  . Highest education level: Not on file  Occupational History  . Not on file  Tobacco Use  . Smoking status: Never Smoker  . Smokeless tobacco: Never Used  Vaping Use  . Vaping Use: Never used  Substance and Sexual Activity  . Alcohol use: Yes    Alcohol/week: 0.0 standard drinks  . Drug use: No  . Sexual activity: Not on file  Other Topics Concern  . Not on file  Social History Narrative  . Not on file   Social Determinants of Health   Financial Resource Strain:   . Difficulty of Paying Living Expenses:   Food Insecurity:   . Worried About Charity fundraiser in the Last Year:   . Arboriculturist in the Last Year:   Transportation Needs:   . Lexicographer (Medical):   Marland Kitchen Lack of Transportation (Non-Medical):   Physical Activity:   . Days of Exercise per Week:   . Minutes of Exercise per Session:   Stress:   . Feeling of Stress :   Social Connections:   . Frequency of Communication with Friends and Family:   . Frequency of Social Gatherings with Friends and Family:   . Attends Religious Services:   . Active Member of Clubs or Organizations:   . Attends Archivist Meetings:   Marland Kitchen Marital Status:   Intimate Partner Violence:   . Fear of Current or Ex-Partner:   . Emotionally Abused:   Marland Kitchen Physically Abused:   . Sexually Abused:     Current Outpatient Medications on File Prior to Visit  Medication Sig Dispense Refill  . ACCU-CHEK SOFTCLIX LANCETS lancets Use as instructed to check blood sugar twice daily 100 each 12  . allopurinol (ZYLOPRIM) 300 MG tablet Take 300 mg by mouth daily.    Marland Kitchen atorvastatin (LIPITOR) 80 MG tablet Take 80 mg by mouth daily.    . benzonatate (TESSALON) 100 MG capsule Take 100 mg by mouth as needed.    Marland Kitchen buPROPion (WELLBUTRIN SR) 150 MG 12 hr tablet     . buPROPion [facility]  XL) 300 MG 24 hr tablet Take 300 mg by mouth daily.    Marland Kitchen docusate sodium (COLACE) 100 MG capsule Take 100 mg by mouth daily as needed for mild constipation.    Marland Kitchen ezetimibe (ZETIA) 10 MG tablet Take 10 mg by mouth daily.    . fenofibrate 54 MG tablet Take 54 mg by mouth daily.    . furosemide (LASIX) 20 MG tablet Take 20 mg by mouth.    Marland Kitchen HYDROcodone-acetaminophen (NORCO/VICODIN) 5-325 MG tablet     . isosorbide mononitrate (IMDUR) 60 MG 24 hr tablet TAKE 1 TABLET (60 MG TOTAL) BY MOUTH DAILY. 90 tablet 2  . losartan (COZAAR) 100 MG tablet Take 100 mg by mouth daily.    . nitroGLYCERIN (NITROSTAT) 0.4 MG SL tablet Place 1 tablet (0.4 mg total) under the tongue every 5 (five) minutes as needed for chest pain. 75 tablet 1  . warfarin (COUMADIN) 2.5 MG tablet Take 2.5 mg by mouth daily. TAKE 1 TABLET MON, WED, FRI AND A  1/2 TABLET TUES, THURS, SAT AND SUN    . carvedilol (COREG) 25 MG tablet Take 1 tablet (25 mg total) by mouth 2 (two) times daily. 180 tablet 3   No current facility-administered medications on file prior to visit.    No Known Allergies  Family History  Problem Relation Age of Onset  . Diabetes Father   . Heart disease Father   . Heart disease Mother   . Cancer Neg Hx     BP 112/76   Pulse 81   Ht 5\' 10"  (1.778 m)   Wt 275 lb (124.7 kg)   SpO2 97%   BMI 39.46 kg/m   Review of Systems Denies LOC    Objective:   Physical Exam VITAL SIGNS:  See vs page GENERAL: no distress SKIN: no ulcer on the feet.  CV: 2+ bilat leg edema, and bilat large vv's (R>L).   EXTEMITIES: no deformity.  feet are of normal temp, but there is severe rust-colored hyperpigmentation of the legs.  There is bilateral onychomycosis of the toenails.   PULSES: dorsalis pedis absent bilat (poss due to edema).   NEURO:  sensation is intact to touch on the feet, but decreased from normal.     Lab Results  Component Value Date   HGBA1C 6.2 (A) 01/13/2020   Lab Results  Component Value Date   CREATININE 2.12 (H) 12/08/2019   BUN 40 (H) 12/08/2019   NA 142 12/08/2019   K 4.4 12/08/2019   CL 105 12/08/2019   CO2 24 12/08/2019       Assessment & Plan:  Insulin-requiring type 2 DM, with stage 4 CRI. Hypoglycemia, due to insulin: this limits aggressiveness of glycemic control   Patient Instructions  check your blood sugar twice a day.  vary the time of day when you check, between before the 3 meals, and at bedtime.  also check if you have symptoms of your blood sugar being too high or too low.  please keep a record of the readings and bring it to your next appointment here (or you can bring the meter itself).  You can write it on any piece of paper.  please call us sooner if your blood sugar goes below 70, or if you have a lot of readings over 200. Please reduce the breakfast Novolog to 16 units.    Please come back for a follow-up appointment in 4 months.

## 2020-01-15 ENCOUNTER — Telehealth: Payer: Self-pay | Admitting: Endocrinology

## 2020-01-15 ENCOUNTER — Other Ambulatory Visit: Payer: Self-pay

## 2020-01-15 DIAGNOSIS — E1122 Type 2 diabetes mellitus with diabetic chronic kidney disease: Secondary | ICD-10-CM

## 2020-01-15 DIAGNOSIS — Z794 Long term (current) use of insulin: Secondary | ICD-10-CM

## 2020-01-15 DIAGNOSIS — N1831 Chronic kidney disease, stage 3a: Secondary | ICD-10-CM

## 2020-01-15 MED ORDER — GLUCOSE BLOOD VI STRP: 1.0000 | ORAL_STRIP | Freq: Two times a day (BID) | 2 refills | Status: DC

## 2020-01-15 NOTE — Telephone Encounter (Signed)
Patient returned call and he is using an Accu Chek Aviva meter

## 2020-01-15 NOTE — Telephone Encounter (Signed)
Outpatient Medication Detail   Disp Refills Start End   glucose blood test strip 100 each 2 01/15/2020    Sig - Route: 1 each by Other route 2 (two) times daily. For use with Accu chek aviva meter; E11.9 - Other   Sent to pharmacy as: glucose blood test strip   E-Prescribing Status: Receipt confirmed by pharmacy (01/15/2020 10:46 AM EDT)

## 2020-01-15 NOTE — Telephone Encounter (Signed)
Pharmacy called stating we sent over glucose test strips but we need to specify which brand of test strips and they requested we send over a new one specifying.

## 2020-01-15 NOTE — Telephone Encounter (Signed)
Unfortunately, pt will need to provide the specific Accu Chek meter he is using to ensure the test strips Rx we send is compatible with his device. Called pt to inquire further. LVM requesting returned call.

## 2020-01-20 DIAGNOSIS — Z7901 Long term (current) use of anticoagulants: Secondary | ICD-10-CM | POA: Diagnosis not present

## 2020-01-20 DIAGNOSIS — I4891 Unspecified atrial fibrillation: Secondary | ICD-10-CM | POA: Diagnosis not present

## 2020-01-20 DIAGNOSIS — Z0100 Encounter for examination of eyes and vision without abnormal findings: Secondary | ICD-10-CM | POA: Diagnosis not present

## 2020-01-31 ENCOUNTER — Other Ambulatory Visit: Payer: Self-pay | Admitting: Nurse Practitioner

## 2020-02-03 DIAGNOSIS — Z7901 Long term (current) use of anticoagulants: Secondary | ICD-10-CM | POA: Diagnosis not present

## 2020-02-03 DIAGNOSIS — I4891 Unspecified atrial fibrillation: Secondary | ICD-10-CM | POA: Diagnosis not present

## 2020-03-02 DIAGNOSIS — I4891 Unspecified atrial fibrillation: Secondary | ICD-10-CM | POA: Diagnosis not present

## 2020-03-02 DIAGNOSIS — Z7901 Long term (current) use of anticoagulants: Secondary | ICD-10-CM | POA: Diagnosis not present

## 2020-03-23 NOTE — Progress Notes (Signed)
CARDIOLOGY OFFICE NOTE  Date:  04/06/2020    Shane Kelly Date of Birth: 1951/02/04 Medical Record #[id number]  PCP:  Lois Huxley, PA  Cardiologist:  Marisa Cyphers  Chief Complaint  Patient presents with  . Follow-up    Seen for Dr. Marlou Porch    History of Present Illness: Shane Kelly is a 69 y.o. male who presents today for a 4 month check. Seen for Dr. Marlou Porch.   He has a history of DM, CAD, persistent AF, chronic anticoagulation,HTN, HLD, CKDand presumed ICM. He has had prior MI. He had prior coronary artery stent  2in2008 as [place] He has had issues with anxiety that have resulted inchronicchest discomfort.Echo from 2016 showednormal EF.INR is followed by PCP.  Last seen by Dr. Marlou Porch in [facility] 2018- felt to be doing well.I have seen him since - he got very depressed due to his son's death - was using food as comfort. PCP monitors his coumadin.He has had worsening CKD - stopped his Aldactone -he was referred to [facility] as well.Whenseen back in October - he was not doing well - the pandemic had been very hard - very depressed due to the isolation.  He was admitted [facility]) [facility] of [facility] - presented with acute shortness of breath and hyoxia. Lots of salty food endorsed. Treated with NTG, Lasix, BiPap - CXR with vascular congestion and pulmonary edema.CT with pneumonia.EF of 35% - positive troponin noted - concluded as"demand ischemia"- no actual chest pain. Troponin was 29 and then 52.  I then saw him in follow up - he was feeling better - on more Lasix - cardiac cath felt to be needed due to the reduction of his EFbut given the degree of CKD - favored trying to get him medically tuned up before proceeding - he was to see Renal for discussion as well.Last seen in March - doing ok. Had had a "heavy feeling" with his prior CAD event - this has  not recurred. He was restricting his salt. His weight was continuing to trend downwards.  He had seen Renal who he says had agreed with 90 days of guideline therapy - then repeat echo and then determine about cath or not. He was not really wanting to have a cath. His repeat echo did not show improvement - EF 35 to 40% - but he elected to continue with medical therapy. Last seen in May - was doing ok - weight was down a few pounds. Elected to continue with medical management.   Comes in today. Here alone. He has had some close friends die - one with COVID that would not get vaccinated or mask. Got pretty upset with this. Another died with heart attack. Has been pretty depressed with all this. Works out in his building - this is good for him - he makes knives and sharpens them. His grandson is his life. No chest pain. Breathing is stable. Has lost his voice recently. Typically gets lots of "little colds". He notes that everytime he comes to the doctor his heart rate will go up. Typically fine at home. BP is good at home as well. Weight is unchanged. Swelling is stable. Going for his physical tomorrow with labs.   Past Medical History:  Diagnosis Date  . Diabetes ([place])   . Heart attack ([place])   . Hyperlipidemia   . Hypertension   . Irregular heart beat   . Kidney  disease     Past Surgical History:  Procedure Laterality Date  . LEFT KNEE REPLACEMENT    . RIGHT HIP REPLACEMENT    . STENTS  2008/2014     Medications: Current Meds  Medication Sig  . ACCU-CHEK SOFTCLIX LANCETS lancets Use as instructed to check blood sugar twice daily  . allopurinol (ZYLOPRIM) 300 MG tablet Take 300 mg by mouth daily.  Marland Kitchen atorvastatin (LIPITOR) 80 MG tablet Take 80 mg by mouth daily.  . benzonatate (TESSALON) 100 MG capsule Take 100 mg by mouth as needed.  Marland Kitchen buPROPion (WELLBUTRIN SR) 150 MG 12 hr tablet   . buPROPion (WELLBUTRIN XL) 300 MG 24 hr tablet Take 300 mg by mouth daily.  Marland Kitchen docusate sodium (COLACE) 100  MG capsule Take 100 mg by mouth daily as needed for mild constipation.  Marland Kitchen ezetimibe (ZETIA) 10 MG tablet Take 10 mg by mouth daily.  . fenofibrate 54 MG tablet Take 54 mg by mouth daily.  Marland Kitchen Fexofenadine HCl (MUCINEX ALLERGY PO) Take by mouth.  . furosemide (LASIX) 20 MG tablet Take 20 mg by mouth.  Marland Kitchen glucose blood test strip 1 each by Other route 2 (two) times daily. For use with Accu chek aviva meter; E11.9  . HYDROcodone-acetaminophen (NORCO/VICODIN) 5-325 MG tablet   . insulin aspart (NOVOLOG) 100 UNIT/ML injection 3 times a day (just before each meal) 16-8-20 units, and syringes 3/day  . isosorbide mononitrate (IMDUR) 60 MG 24 hr tablet TAKE 1 TABLET EVERY DAY  . losartan (COZAAR) 100 MG tablet Take 100 mg by mouth daily.  . nitroGLYCERIN (NITROSTAT) 0.4 MG SL tablet Place 1 tablet (0.4 mg total) under the tongue every 5 (five) minutes as needed for chest pain.  Marland Kitchen warfarin (COUMADIN) 2.5 MG tablet Take 2.5 mg by mouth daily. TAKE 1 TABLET MON, WED, FRI AND A 1/2 TABLET TUES, THURS, SAT AND SUN     Allergies: No Known Allergies  Social History: The patient  reports that he has never smoked. He has never used smokeless tobacco. He reports current alcohol use. He reports that he does not use drugs.   Family History: The patient's family history includes Diabetes in his father; Heart disease in his father and mother.   Review of Systems: Please see the history of present illness.   All other systems are reviewed and negative.   Physical Exam: VS:  BP 110/86   Pulse 95   Ht 5\' 9"  (1.753 m)   Wt 275 lb 12.8 oz (125.1 kg)   SpO2 98%   BMI 40.73 kg/m  .  BMI Body mass index is 40.73 kg/m.  Wt Readings from Last 3 Encounters:  04/06/20 275 lb 12.8 oz (125.1 kg)  01/13/20 275 lb (124.7 kg)  12/08/19 279 lb 6.4 oz (126.7 kg)    General: Alert and in no acute distress.  Seems a little down today.  Cardiac: Irregular irregular rhythm. Rate is ok by my exam. Legs are chronically  full with stable edema.  Respiratory:  Lungs are clear to auscultation bilaterally with normal work of breathing.  GI: Soft and nontender.  MS: No deformity or atrophy. Gait and ROM intact.  Skin: Warm and dry. Color is normal.  Neuro:  Strength and sensation are intact and no gross focal deficits noted.  Psych: Alert, appropriate and with normal affect.   LABORATORY DATA:  EKG:  EKG is not ordered today.    Lab Results  Component Value Date   WBC 6.4 12/08/2019  HGB 14.7 12/08/2019   HCT 45.0 12/08/2019   PLT 128 (L) 12/08/2019   GLUCOSE 135 (H) 12/08/2019   NA 142 12/08/2019   K 4.4 12/08/2019   CL 105 12/08/2019   CREATININE 2.12 (H) 12/08/2019   BUN 40 (H) 12/08/2019   CO2 24 12/08/2019   HGBA1C 6.2 (A) 01/13/2020       BNP (last 3 results) No results for input(s): BNP in the last 8760 hours.  ProBNP (last 3 results) No results for input(s): PROBNP in the last 8760 hours.   Other Studies Reviewed Today:  ECHO IMPRESSIONS 11/2019  1. Left ventricular ejection fraction, by estimation, is 35 to 40%. The  left ventricle has moderately decreased function. The left ventricle  demonstrates regional wall motion abnormalities (see scoring  diagram/findings for description). There is mild  concentric left ventricular hypertrophy. Left ventricular diastolic  function could not be evaluated.  2. Right ventricular systolic function is mildly reduced. The right  ventricular size is normal. There is moderately elevated pulmonary artery  systolic pressure.  3. Left atrial size was severely dilated.  4. Right atrial size was mildly dilated.  5. The mitral valve is grossly normal. Mild to moderate mitral valve  regurgitation. No evidence of mitral stenosis.  6. The aortic valve is grossly normal. Aortic valve regurgitation is not  visualized. No aortic stenosis is present.    ECHOFINDINGS1/2021: LEFT VENTRICLE There is mild eccentric left ventricular  hypertrophy. inferior basal akinesis, global moderate hypokinesis, EF 35%    CT CHESTIMPRESSION1/2021: 1. Cardiomegaly with pulmonary edema and small bilateral pleural effusions. Ground-glass opacities in the left lower lobe are slightly more patchy/confluent and may reflect superimposed pneumonia. 2. Increased number of small mediastinal lymph nodes are likely reactive in the setting of congestive heart failure. 3. Prominent main pulmonary artery which can be seen with pulmonary arterial hypertension. 4. Coronary artery calcifications versus stents.  Aortic Atherosclerosis (ICD10-I70.0).   Electronically Signed By: Keith Rake M.D. On: 08/22/2019 03:35   EchoStudy Conclusions2016  - Left ventricle: The cavity size was normal. Wall thickness was increased in a pattern of mild LVH. Systolic function was normal. The estimated ejection fraction was in the range of 55% to 60%. - Left atrium: The atrium was moderately dilated. - Atrial septum: No defect or patent foramen ovale was identified.   ASSESSMENT & PLAN:   1. Chronic systolic HF - EF at 35 to 40% - he has opted for continued medical management. He did not wish to pursue cardiac cath due to his CKD and risk of worsening kidney function. Doing ok clinically. NYHA II. Not able to afford Entresto and suspect his CKD would be a limiting factor as well. He is happy with how he is doing.   2. Known CAD - did have prior elevation of his troponin back with his admission last January 2021 - deemed "demand ischemia". He is on chronic nitrate therapy. No worrisome symptoms.   3. CKD - followed by Renal. Seems to have stabilized. Has been able to stay on ARB but could consider transitioning to Hydralazine if needed.   4. Persistent AF - managed with rate control with Coreg - HR is better by my exam.   5. HLD - on statin - would continue.   6. Chronic anticoagulation with Coumadin - no problems noted. No  bleeding.   7. DM - per PCP  Current medicines are reviewed with the patient today.  The patient does not have concerns regarding medicines other  than what has been noted above.  The following changes have been made:  See above.  Labs/ tests ordered today include:   No orders of the defined types were placed in this encounter.    Disposition:   FU with [place] in 4 months.    Patient is agreeable to this plan and will call if any problems develop in the interim.   SignedTruitt Merle, NP  04/06/2020 10:11 AM  [facility] [address] [place], [place]  [address] Phone: [phone] Fax: [phone]

## 2020-04-01 DIAGNOSIS — N184 Chronic kidney disease, stage 4 (severe): Secondary | ICD-10-CM | POA: Diagnosis not present

## 2020-04-01 DIAGNOSIS — I509 Heart failure, unspecified: Secondary | ICD-10-CM | POA: Diagnosis not present

## 2020-04-01 DIAGNOSIS — E1165 Type 2 diabetes mellitus with hyperglycemia: Secondary | ICD-10-CM | POA: Diagnosis not present

## 2020-04-01 DIAGNOSIS — E782 Mixed hyperlipidemia: Secondary | ICD-10-CM | POA: Diagnosis not present

## 2020-04-01 DIAGNOSIS — I4891 Unspecified atrial fibrillation: Secondary | ICD-10-CM | POA: Diagnosis not present

## 2020-04-01 DIAGNOSIS — F329 Major depressive disorder, single episode, unspecified: Secondary | ICD-10-CM | POA: Diagnosis not present

## 2020-04-01 DIAGNOSIS — I10 Essential (primary) hypertension: Secondary | ICD-10-CM | POA: Diagnosis not present

## 2020-04-01 DIAGNOSIS — I2510 Atherosclerotic heart disease of native coronary artery without angina pectoris: Secondary | ICD-10-CM | POA: Diagnosis not present

## 2020-04-01 DIAGNOSIS — E1122 Type 2 diabetes mellitus with diabetic chronic kidney disease: Secondary | ICD-10-CM | POA: Diagnosis not present

## 2020-04-06 ENCOUNTER — Encounter: Payer: Self-pay | Admitting: Nurse Practitioner

## 2020-04-06 ENCOUNTER — Ambulatory Visit: Payer: Medicare HMO | Admitting: Nurse Practitioner

## 2020-04-06 ENCOUNTER — Other Ambulatory Visit: Payer: Self-pay

## 2020-04-06 VITALS — BP 110/86 | HR 95 | Ht 69.0 in | Wt 275.8 lb

## 2020-04-06 DIAGNOSIS — I5022 Chronic systolic (congestive) heart failure: Secondary | ICD-10-CM

## 2020-04-06 DIAGNOSIS — N189 Chronic kidney disease, unspecified: Secondary | ICD-10-CM | POA: Diagnosis not present

## 2020-04-06 DIAGNOSIS — Z7901 Long term (current) use of anticoagulants: Secondary | ICD-10-CM | POA: Diagnosis not present

## 2020-04-06 DIAGNOSIS — I4819 Other persistent atrial fibrillation: Secondary | ICD-10-CM | POA: Diagnosis not present

## 2020-04-06 DIAGNOSIS — I259 Chronic ischemic heart disease, unspecified: Secondary | ICD-10-CM

## 2020-04-06 NOTE — Patient Instructions (Addendum)
After Visit Summary:  We will be checking the following labs today - NONE   Medication Instructions:    Continue with your current medicines.    If you need a refill on your cardiac medications before your next appointment, please call your pharmacy.     Testing/Procedures To Be Arranged:  N/A  Follow-Up:   See me in about 4 months     At [facility], you and your health needs are our priority.  As part of our continuing mission to provide you with exceptional heart care, we have created designated Provider Care Teams.  These Care Teams include your primary Cardiologist (physician) and Advanced Practice Providers (APPs -  Physician Assistants and Nurse Practitioners) who all work together to provide you with the care you need, when you need it.  Special Instructions:  . Stay safe, wash your hands for at least 20 seconds and wear a mask when needed.  . It was good to talk with you today.    Call the [place] office at [phone] if you have any questions, problems or concerns.

## 2020-04-07 DIAGNOSIS — Z125 Encounter for screening for malignant neoplasm of prostate: Secondary | ICD-10-CM | POA: Diagnosis not present

## 2020-04-07 DIAGNOSIS — I25119 Atherosclerotic heart disease of native coronary artery with unspecified angina pectoris: Secondary | ICD-10-CM | POA: Diagnosis not present

## 2020-04-07 DIAGNOSIS — Z0000 Encounter for general adult medical examination without abnormal findings: Secondary | ICD-10-CM | POA: Diagnosis not present

## 2020-04-07 DIAGNOSIS — I10 Essential (primary) hypertension: Secondary | ICD-10-CM | POA: Diagnosis not present

## 2020-04-07 DIAGNOSIS — Z1389 Encounter for screening for other disorder: Secondary | ICD-10-CM | POA: Diagnosis not present

## 2020-04-07 DIAGNOSIS — D696 Thrombocytopenia, unspecified: Secondary | ICD-10-CM | POA: Diagnosis not present

## 2020-04-07 DIAGNOSIS — M1A9XX0 Chronic gout, unspecified, without tophus (tophi): Secondary | ICD-10-CM | POA: Diagnosis not present

## 2020-04-07 DIAGNOSIS — Z7901 Long term (current) use of anticoagulants: Secondary | ICD-10-CM | POA: Diagnosis not present

## 2020-04-07 DIAGNOSIS — E782 Mixed hyperlipidemia: Secondary | ICD-10-CM | POA: Diagnosis not present

## 2020-04-07 DIAGNOSIS — I4891 Unspecified atrial fibrillation: Secondary | ICD-10-CM | POA: Diagnosis not present

## 2020-04-07 DIAGNOSIS — E1122 Type 2 diabetes mellitus with diabetic chronic kidney disease: Secondary | ICD-10-CM | POA: Diagnosis not present

## 2020-04-07 DIAGNOSIS — G894 Chronic pain syndrome: Secondary | ICD-10-CM | POA: Diagnosis not present

## 2020-04-07 DIAGNOSIS — Z23 Encounter for immunization: Secondary | ICD-10-CM | POA: Diagnosis not present

## 2020-04-07 DIAGNOSIS — I509 Heart failure, unspecified: Secondary | ICD-10-CM | POA: Diagnosis not present

## 2020-04-19 DIAGNOSIS — I10 Essential (primary) hypertension: Secondary | ICD-10-CM | POA: Diagnosis not present

## 2020-04-21 DIAGNOSIS — I10 Essential (primary) hypertension: Secondary | ICD-10-CM | POA: Diagnosis not present

## 2020-05-11 DIAGNOSIS — Z7901 Long term (current) use of anticoagulants: Secondary | ICD-10-CM | POA: Diagnosis not present

## 2020-05-11 DIAGNOSIS — I4891 Unspecified atrial fibrillation: Secondary | ICD-10-CM | POA: Diagnosis not present

## 2020-05-11 DIAGNOSIS — Z1211 Encounter for screening for malignant neoplasm of colon: Secondary | ICD-10-CM | POA: Diagnosis not present

## 2020-05-17 ENCOUNTER — Ambulatory Visit: Payer: Medicare HMO | Admitting: Endocrinology

## 2020-05-17 ENCOUNTER — Other Ambulatory Visit: Payer: Self-pay

## 2020-05-17 VITALS — BP 132/90 | HR 83 | Ht 69.5 in | Wt 272.0 lb

## 2020-05-17 DIAGNOSIS — E1122 Type 2 diabetes mellitus with diabetic chronic kidney disease: Secondary | ICD-10-CM | POA: Diagnosis not present

## 2020-05-17 DIAGNOSIS — N1831 Chronic kidney disease, stage 3a: Secondary | ICD-10-CM

## 2020-05-17 DIAGNOSIS — Z794 Long term (current) use of insulin: Secondary | ICD-10-CM | POA: Diagnosis not present

## 2020-05-17 LAB — POCT GLYCOSYLATED HEMOGLOBIN (HGB A1C): Hemoglobin A1C: 6.3 % — AB (ref 4.0–5.6)

## 2020-05-17 MED ORDER — ACCU-CHEK AVIVA PLUS VI STRP: 1.0000 | ORAL_STRIP | Freq: Two times a day (BID) | 12 refills | Status: AC

## 2020-05-17 NOTE — Progress Notes (Signed)
Subjective:    Patient ID: Shane Kelly, male    DOB: [birth year], 69 y.o.   MRN: [id number]  HPI Pt returns for f/u of diabetes mellitus: DM type: Insulin-requiring type 2.   Dx'ed: 0539 Complications: PN, CAD, and stage 4 CRI.  Therapy: insulin since soon after dx DKA: never Severe hypoglycemia: never Pancreatitis: never Pancreatic imaging: never SDOH: pt says he cannot afford GLP med.   Other: he takes multiple daily injections; due to renal failure, he does not need basal insulin; fructosamine has shown similar glycemic control to A1c; He often skips lunch (and lunch insulin).   Interval history: no cbg record, but states cbg's vary from 80-168.  There is no trend throughout the day.  No recent steroids. He seldom misses the insulin.   Past Medical History:  Diagnosis Date  . Diabetes ([place])   . Heart attack ([place])   . Hyperlipidemia   . Hypertension   . Irregular heart beat   . Kidney disease     Past Surgical History:  Procedure Laterality Date  . LEFT KNEE REPLACEMENT    . RIGHT HIP REPLACEMENT    . Erasmo Downer  2008/2014    Social History   Socioeconomic History  . Marital status: Married    Spouse name: Not on file  . Number of children: Not on file  . Years of education: Not on file  . Highest education level: Not on file  Occupational History  . Not on file  Tobacco Use  . Smoking status: Never Smoker  . Smokeless tobacco: Never Used  Vaping Use  . Vaping Use: Never used  Substance and Sexual Activity  . Alcohol use: Yes    Alcohol/week: 0.0 standard drinks  . Drug use: No  . Sexual activity: Not on file  Other Topics Concern  . Not on file  Social History Narrative  . Not on file   Social Determinants of Health   Financial Resource Strain:   . Difficulty of Paying Living Expenses: Not on file  Food Insecurity:   . Worried About Charity fundraiser in the Last Year: Not on file  . Ran Out of Food in the Last Year: Not on file  Transportation  Needs:   . Lack of Transportation (Medical): Not on file  . Lack of Transportation (Non-Medical): Not on file  Physical Activity:   . Days of Exercise per Week: Not on file  . Minutes of Exercise per Session: Not on file  Stress:   . Feeling of Stress : Not on file  Social Connections:   . Frequency of Communication with Friends and Family: Not on file  . Frequency of Social Gatherings with Friends and Family: Not on file  . Attends Religious Services: Not on file  . Active Member of Clubs or Organizations: Not on file  . Attends Archivist Meetings: Not on file  . Marital Status: Not on file  Intimate Partner Violence:   . Fear of Current or Ex-Partner: Not on file  . Emotionally Abused: Not on file  . Physically Abused: Not on file  . Sexually Abused: Not on file    Current Outpatient Medications on File Prior to Visit  Medication Sig Dispense Refill  . ACCU-CHEK SOFTCLIX LANCETS lancets Use as instructed to check blood sugar twice daily 100 each 12  . allopurinol (ZYLOPRIM) 300 MG tablet Take 300 mg by mouth daily.    Marland Kitchen atorvastatin (LIPITOR) 80 MG tablet Take 80 mg by  mouth daily.    . benzonatate (TESSALON) 100 MG capsule Take 100 mg by mouth as needed.    Marland Kitchen buPROPion (WELLBUTRIN SR) 150 MG 12 hr tablet     . buPROPion (WELLBUTRIN XL) 300 MG 24 hr tablet Take 300 mg by mouth daily.    Marland Kitchen docusate sodium (COLACE) 100 MG capsule Take 100 mg by mouth daily as needed for mild constipation.    Marland Kitchen ezetimibe (ZETIA) 10 MG tablet Take 10 mg by mouth daily.    . fenofibrate 54 MG tablet Take 54 mg by mouth daily.    Marland Kitchen Fexofenadine HCl (MUCINEX ALLERGY PO) Take by mouth.    . furosemide (LASIX) 20 MG tablet Take 20 mg by mouth.    Marland Kitchen HYDROcodone-acetaminophen (NORCO/VICODIN) 5-325 MG tablet     . insulin aspart (NOVOLOG) 100 UNIT/ML injection 3 times a day (just before each meal) 16-8-20 units, and syringes 3/day 10 mL 11  . isosorbide mononitrate (IMDUR) 60 MG 24 hr tablet  TAKE 1 TABLET EVERY DAY 90 tablet 2  . losartan (COZAAR) 100 MG tablet Take 100 mg by mouth daily.    . nitroGLYCERIN (NITROSTAT) 0.4 MG SL tablet Place 1 tablet (0.4 mg total) under the tongue every 5 (five) minutes as needed for chest pain. 75 tablet 1  . warfarin (COUMADIN) 2.5 MG tablet Take 2.5 mg by mouth daily. TAKE 1 TABLET MON, WED, FRI AND A 1/2 TABLET TUES, THURS, SAT AND SUN    . carvedilol (COREG) 25 MG tablet Take 1 tablet (25 mg total) by mouth 2 (two) times daily. 180 tablet 3   No current facility-administered medications on file prior to visit.    No Known Allergies  Family History  Problem Relation Age of Onset  . Diabetes Father   . Heart disease Father   . Heart disease Mother   . Cancer Neg Hx     BP 132/90 (BP Location: Left Arm, Patient Position: Sitting, Cuff Size: Normal)   Pulse 83   Ht 5' 9.5" (1.765 m)   Wt 272 lb (123.4 kg)   SpO2 95%   BMI 39.59 kg/m    Review of Systems He denies hypoglycemia.      Objective:   Physical Exam VITAL SIGNS:  See vs page GENERAL: no distress SKIN: no ulcer on the feet; severe rust-colored hyperpigmentation of the legs. CV: 2+ bilat leg edema, and bilat large vv's (R>L).   EXTEMITIES: no deformity.  feet are of normal temp.  There is bilateral onychomycosis of the toenails.   PULSES: dorsalis pedis absent bilat (poss due to edema).   NEURO:  sensation is intact to touch on the feet, but decreased from normal.    Lab Results  Component Value Date   HGBA1C 6.3 (A) 05/17/2020       Assessment & Plan:  Insulin-requiring type 2 DM, with stage 4 CRI: well-controlled   Patient Instructions  check your blood sugar twice a day.  vary the time of day when you check, between before the 3 meals, and at bedtime.  also check if you have symptoms of your blood sugar being too high or too low.  please keep a record of the readings and bring it to your next appointment here (or you can bring the meter itself).  You can  write it on any piece of paper.  please call us sooner if your blood sugar goes below 70, or if you have a lot of readings over 200. Please  continue the same insulin.   Please come back for a follow-up appointment in 4 months.

## 2020-05-17 NOTE — Patient Instructions (Addendum)
check your blood sugar twice a day.  vary the time of day when you check, between before the 3 meals, and at bedtime.  also check if you have symptoms of your blood sugar being too high or too low.  please keep a record of the readings and bring it to your next appointment here (or you can bring the meter itself).  You can write it on any piece of paper.  please call us sooner if your blood sugar goes below 70, or if you have a lot of readings over 200. Please continue the same insulin.   Please come back for a follow-up appointment in 4 months.

## 2020-05-18 DIAGNOSIS — N2581 Secondary hyperparathyroidism of renal origin: Secondary | ICD-10-CM | POA: Diagnosis not present

## 2020-05-18 DIAGNOSIS — E1122 Type 2 diabetes mellitus with diabetic chronic kidney disease: Secondary | ICD-10-CM | POA: Diagnosis not present

## 2020-05-18 DIAGNOSIS — M109 Gout, unspecified: Secondary | ICD-10-CM | POA: Diagnosis not present

## 2020-05-18 DIAGNOSIS — I5022 Chronic systolic (congestive) heart failure: Secondary | ICD-10-CM | POA: Diagnosis not present

## 2020-05-18 DIAGNOSIS — N1832 Chronic kidney disease, stage 3b: Secondary | ICD-10-CM | POA: Diagnosis not present

## 2020-05-18 DIAGNOSIS — N189 Chronic kidney disease, unspecified: Secondary | ICD-10-CM | POA: Diagnosis not present

## 2020-05-18 DIAGNOSIS — D631 Anemia in chronic kidney disease: Secondary | ICD-10-CM | POA: Diagnosis not present

## 2020-05-18 DIAGNOSIS — I129 Hypertensive chronic kidney disease with stage 1 through stage 4 chronic kidney disease, or unspecified chronic kidney disease: Secondary | ICD-10-CM | POA: Diagnosis not present

## 2020-06-08 DIAGNOSIS — I4891 Unspecified atrial fibrillation: Secondary | ICD-10-CM | POA: Diagnosis not present

## 2020-06-08 DIAGNOSIS — Z7901 Long term (current) use of anticoagulants: Secondary | ICD-10-CM | POA: Diagnosis not present

## 2020-06-14 DIAGNOSIS — N1832 Chronic kidney disease, stage 3b: Secondary | ICD-10-CM | POA: Diagnosis not present

## 2020-06-22 DIAGNOSIS — I4891 Unspecified atrial fibrillation: Secondary | ICD-10-CM | POA: Diagnosis not present

## 2020-06-22 DIAGNOSIS — Z7901 Long term (current) use of anticoagulants: Secondary | ICD-10-CM | POA: Diagnosis not present

## 2020-07-07 DIAGNOSIS — F339 Major depressive disorder, recurrent, unspecified: Secondary | ICD-10-CM | POA: Diagnosis not present

## 2020-07-07 DIAGNOSIS — I2510 Atherosclerotic heart disease of native coronary artery without angina pectoris: Secondary | ICD-10-CM | POA: Diagnosis not present

## 2020-07-07 DIAGNOSIS — G894 Chronic pain syndrome: Secondary | ICD-10-CM | POA: Diagnosis not present

## 2020-07-07 DIAGNOSIS — N184 Chronic kidney disease, stage 4 (severe): Secondary | ICD-10-CM | POA: Diagnosis not present

## 2020-07-07 DIAGNOSIS — Z794 Long term (current) use of insulin: Secondary | ICD-10-CM | POA: Diagnosis not present

## 2020-07-07 DIAGNOSIS — I25119 Atherosclerotic heart disease of native coronary artery with unspecified angina pectoris: Secondary | ICD-10-CM | POA: Diagnosis not present

## 2020-07-07 DIAGNOSIS — E1122 Type 2 diabetes mellitus with diabetic chronic kidney disease: Secondary | ICD-10-CM | POA: Diagnosis not present

## 2020-07-07 DIAGNOSIS — E782 Mixed hyperlipidemia: Secondary | ICD-10-CM | POA: Diagnosis not present

## 2020-07-07 DIAGNOSIS — D6859 Other primary thrombophilia: Secondary | ICD-10-CM | POA: Diagnosis not present

## 2020-07-07 DIAGNOSIS — I10 Essential (primary) hypertension: Secondary | ICD-10-CM | POA: Diagnosis not present

## 2020-07-07 DIAGNOSIS — D696 Thrombocytopenia, unspecified: Secondary | ICD-10-CM | POA: Diagnosis not present

## 2020-07-20 DIAGNOSIS — I4891 Unspecified atrial fibrillation: Secondary | ICD-10-CM | POA: Diagnosis not present

## 2020-07-20 DIAGNOSIS — Z7901 Long term (current) use of anticoagulants: Secondary | ICD-10-CM | POA: Diagnosis not present

## 2020-07-27 NOTE — Progress Notes (Signed)
CARDIOLOGY OFFICE NOTE  Date:  08/02/2020    Shane Kelly Date of Birth: 09/05/1950 Medical Record #[id number]  PCP:  Lois Huxley, PA  Cardiologist:  Marisa Cyphers  Chief Complaint  Patient presents with  . Follow-up    Seen for Dr. Marlou Porch    History of Present Illness: Shane Kelly is a 70 y.o. male who presents today for a follow up visit. Seen for Dr. Marlou Porch.    He has a history of DM, CAD, persistent AF, chronic anticoagulation, HTN, HLD, CKD and presumed ICM. He has had prior MI. He had prior coronary artery stent  2 in 2008 as well as stent 2 back in 2014 - all done in [place] (no details).  He has had issues with anxiety that have resulted in chronic chest discomfort. Echo from 2016 showed normal EF. INR is followed by PCP.    Last seen by Dr. Marlou Porch in July of 2018 - felt to be doing well. I have seen him since - he got very depressed due to his son's death - was using food as comfort. PCP monitors his coumadin. He has had worsening CKD - stopped his Aldactone - he was referred to [facility] as well. When seen back in October - he was not doing well - the pandemic had been very hard - very depressed due to the isolation.    He was admitted to [facility]) at the last part of January of 2021 - presented with acute shortness of breath and hyoxia. Lots of salty food endorsed. Treated with NTG, Lasix, BiPap - CXR with vascular congestion and pulmonary edema. CT with pneumonia. EF of 35% - positive troponin noted - concluded as "demand ischemia" - no actual chest pain. Troponin was 29 and then 52.    I then saw him in follow up - he was feeling better - on more Lasix - cardiac cath felt to be needed due to the reduction of his EF but given the degree of CKD - favored trying to get him medically tuned up before proceeding - he was to see Renal for discussion as well. Last seen in March - doing ok. Had had a "heavy feeling" with his prior CAD event - this has  not recurred. He was restricting his salt. His weight was continuing to trend downwards.  He had seen Renal who he says had agreed with 90 days of guideline therapy - then repeat echo and then determine about cath or not. He was not really wanting to have a cath. His repeat echo did not show improvement - EF 35 to 40% - but he elected to continue with medical therapy. Last seen in September - more depression - had had close friends die from [place] who would not get vaccinated or mask.   Comes in today. Here alone. Says he is ok. He was started on Jardiance - felt bad with trying to take Jardiance - he had more swelling and more DOE - using more Lasix with good results. He has stopped this for the past few days - now with no swelling and feels much better. He is seeing PCP next week for discussion of this. He did not like how he felt with this - got "panicky" and anxious. He typically has good BP control at home with lower diastolics. Not dizzy or lightheaded. Doing very little due to the cold weather - he tries to go outside to his building. Doing more babysitting now  of his grandkids - "starting to get old" - this seems to wear on his patience. Sees Renal - says this has been stable for him. Overall, he feels like he is ok. Admits to some intermittent stress eating at times.   Past Medical History:  Diagnosis Date  . Diabetes ([place])   . Heart attack ([place])   . Hyperlipidemia   . Hypertension   . Irregular heart beat   . Kidney disease     Past Surgical History:  Procedure Laterality Date  . LEFT KNEE REPLACEMENT    . RIGHT HIP REPLACEMENT    . STENTS  2008/2014     Medications: Current Meds  Medication Sig  . ACCU-CHEK SOFTCLIX LANCETS lancets Use as instructed to check blood sugar twice daily  . allopurinol (ZYLOPRIM) 300 MG tablet Take 300 mg by mouth daily.  Marland Kitchen atorvastatin (LIPITOR) 80 MG tablet Take 80 mg by mouth daily.  Marland Kitchen buPROPion (WELLBUTRIN SR) 150 MG 12 hr tablet Take 300 mg by  mouth daily. 2 tabs daily  . carvedilol (COREG) 25 MG tablet Take 1 tablet (25 mg total) by mouth 2 (two) times daily.  Marland Kitchen docusate sodium (COLACE) 100 MG capsule Take 100 mg by mouth daily as needed for mild constipation.  Marland Kitchen ezetimibe (ZETIA) 10 MG tablet Take 10 mg by mouth daily.  . fenofibrate 54 MG tablet Take 54 mg by mouth daily.  Marland Kitchen glucose blood (ACCU-CHEK AVIVA PLUS) test strip 1 each by Other route in the morning and at bedtime. And lancets 2/day  . HYDROcodone-acetaminophen (NORCO/VICODIN) 5-325 MG tablet as needed.  . insulin aspart (NOVOLOG) 100 UNIT/ML injection 3 times a day (just before each meal) 16-8-20 units, and syringes 3/day (Patient taking differently: 3 times a day (just before each meal) 16-8-20 units, sliding scale)  . isosorbide mononitrate (IMDUR) 60 MG 24 hr tablet TAKE 1 TABLET EVERY DAY  . JARDIANCE 10 MG TABS tablet daily.  Marland Kitchen losartan (COZAAR) 100 MG tablet Take 100 mg by mouth daily.  . nitroGLYCERIN (NITROSTAT) 0.4 MG SL tablet Place 1 tablet (0.4 mg total) under the tongue every 5 (five) minutes as needed for chest pain.  Marland Kitchen warfarin (COUMADIN) 2.5 MG tablet Take 2.5 mg by mouth daily. TAKE 1 TABLET MON, WED, FRI AND A 1/2 TABLET TUES, THURS, SAT AND SUN  . [DISCONTINUED] benzonatate (TESSALON) 100 MG capsule Take 100 mg by mouth as needed.  . [DISCONTINUED] buPROPion (WELLBUTRIN SR) 150 MG 12 hr tablet 150 mg in the morning and at bedtime. 2 tabs daily  . [DISCONTINUED] buPROPion (WELLBUTRIN XL) 300 MG 24 hr tablet Take 300 mg by mouth daily.  . [DISCONTINUED] Fexofenadine HCl (MUCINEX ALLERGY PO) Take by mouth.  . [DISCONTINUED] furosemide (LASIX) 20 MG tablet Take 20 mg by mouth.     Allergies: No Known Allergies  Social History: The patient  reports that he has never smoked. He has never used smokeless tobacco. He reports current alcohol use. He reports that he does not use drugs.   Family History: The patient's family history includes Diabetes in his  father; Heart disease in his father and mother.   Review of Systems: Please see the history of present illness.   All other systems are reviewed and negative.   Physical Exam: VS:  BP (!) 120/98   Pulse 94   Ht 5\' 10"  (1.778 m)   Wt 269 lb (122 kg)   SpO2 98%   BMI 38.60 kg/m  .  BMI Body  mass index is 38.6 kg/m.  Wt Readings from Last 3 Encounters:  08/02/20 269 lb (122 kg)  05/17/20 272 lb (123.4 kg)  04/06/20 275 lb 12.8 oz (125.1 kg)    General: Alert and in no acute distress. Affect seems pretty flat to me today. Weight is down 6 pounds.  HEENT: Normal.  Neck: Supple, no JVD, carotid bruits, or masses noted.  Cardiac: Regular rate and rhythm. No murmurs, rubs, or gallops. No edema.  Respiratory:  Lungs are clear to auscultation bilaterally with normal work of breathing.  GI: Soft and nontender.  MS: No deformity or atrophy. Gait and ROM intact.  Skin: Warm and dry. Color is normal.  Neuro:  Strength and sensation are intact and no gross focal deficits noted.  Psych: Alert, appropriate and with normal affect.   LABORATORY DATA:  EKG:  EKG is not ordered today.    Lab Results  Component Value Date   WBC 6.4 12/08/2019   HGB 14.7 12/08/2019   HCT 45.0 12/08/2019   PLT 128 (L) 12/08/2019   GLUCOSE 135 (H) 12/08/2019   NA 142 12/08/2019   K 4.4 12/08/2019   CL 105 12/08/2019   CREATININE 2.12 (H) 12/08/2019   BUN 40 (H) 12/08/2019   CO2 24 12/08/2019   HGBA1C 6.3 (A) 05/17/2020     No results found for: INR, PROTIME   BNP (last 3 results) No results for input(s): BNP in the last 8760 hours.  ProBNP (last 3 results) No results for input(s): PROBNP in the last 8760 hours.   Other Studies Reviewed Today:  ECHO IMPRESSIONS 11/2019   1. Left ventricular ejection fraction, by estimation, is 35 to 40%. The  left ventricle has moderately decreased function. The left ventricle  demonstrates regional wall motion abnormalities (see scoring  diagram/findings  for description). There is mild  concentric left ventricular hypertrophy. Left ventricular diastolic  function could not be evaluated.   2. Right ventricular systolic function is mildly reduced. The right  ventricular size is normal. There is moderately elevated pulmonary artery  systolic pressure.   3. Left atrial size was severely dilated.   4. Right atrial size was mildly dilated.   5. The mitral valve is grossly normal. Mild to moderate mitral valve  regurgitation. No evidence of mitral stenosis.   6. The aortic valve is grossly normal. Aortic valve regurgitation is not  visualized. No aortic stenosis is present.      ECHO FINDINGS 07/2019: LEFT VENTRICLE There is mild eccentric left ventricular hypertrophy. inferior basal akinesis, global moderate hypokinesis, EF 35%      CT CHEST IMPRESSION 07/2019: 1. Cardiomegaly with pulmonary edema and small bilateral pleural effusions. Ground-glass opacities in the left lower lobe are slightly more patchy/confluent and may reflect superimposed pneumonia. 2. Increased number of small mediastinal lymph nodes are likely reactive in the setting of congestive heart failure. 3. Prominent main pulmonary artery which can be seen with pulmonary arterial hypertension. 4. Coronary artery calcifications versus stents.  Aortic Atherosclerosis (ICD10-I70.0).   Electronically Signed   By: Keith Rake M.D.   On: 08/22/2019 03:35     Echo Study Conclusions 2016   - Left ventricle: The cavity size was normal. Wall thickness was   increased in a pattern of mild LVH. Systolic function was normal.   The estimated ejection fraction was in the range of 55% to 60%. - Left atrium: The atrium was moderately dilated. - Atrial septum: No defect or patent foramen ovale was identified.  ASSESSMENT & PLAN:     1. Chronic systolic HF - with EF of 35 to 40% - has opted for continued medical management - did not wish to pursue cardiac cath due to  his CKD and risk of worsening CKD. Not able to afford Entresto and remains on ARB instead. Some worsening symptoms with Jardiance which he has stopped with improvement - would follow for now.   2. Known CAD - prior elevation of troponin a year ago - deemed "demand ischemia" - on chronic nitrate therapy. NTG sl refilled for him today. He has opted for medical management.   3. CKD - followed by Dr. Hollie Salk with Nephrology.   4. Persistent AF - managed with rate control and continued anticoagulation with Coumadin.   5. HLD - on statin - labs by PCP  6. Chronic anticoagulation with Coumadin - no problems noted.   7. DM - per PCP - has not tolerated Jardiance - he is going to stay off of this and discuss at upcoming [place] with PCP.    Current medicines are reviewed with the patient today.  The patient does not have concerns regarding medicines other than what has been noted above.  The following changes have been made:  See above.  Labs/ tests ordered today include:   No orders of the defined types were placed in this encounter.    Disposition:   FU with Dr. Marlou Porch in about 4 months. He is aware that I am leaving next month. Overall, seems to be holding his own - no change with current regimen.   Patient is agreeable to this plan and will call if any problems develop in the interim.   SignedTruitt Merle, NP  08/02/2020 10:05 AM  [facility] [address] [place], [place]  [address] Phone: [phone] Fax: [phone]

## 2020-08-02 ENCOUNTER — Ambulatory Visit: Payer: Medicare HMO | Admitting: Nurse Practitioner

## 2020-08-02 ENCOUNTER — Other Ambulatory Visit: Payer: Self-pay

## 2020-08-02 ENCOUNTER — Encounter: Payer: Self-pay | Admitting: Nurse Practitioner

## 2020-08-02 VITALS — BP 120/98 | HR 94 | Ht 70.0 in | Wt 269.0 lb

## 2020-08-02 DIAGNOSIS — I4819 Other persistent atrial fibrillation: Secondary | ICD-10-CM

## 2020-08-02 DIAGNOSIS — N189 Chronic kidney disease, unspecified: Secondary | ICD-10-CM

## 2020-08-02 DIAGNOSIS — I255 Ischemic cardiomyopathy: Secondary | ICD-10-CM

## 2020-08-02 DIAGNOSIS — Z7901 Long term (current) use of anticoagulants: Secondary | ICD-10-CM

## 2020-08-02 DIAGNOSIS — I5022 Chronic systolic (congestive) heart failure: Secondary | ICD-10-CM | POA: Diagnosis not present

## 2020-08-02 DIAGNOSIS — I259 Chronic ischemic heart disease, unspecified: Secondary | ICD-10-CM | POA: Diagnosis not present

## 2020-08-02 MED ORDER — NITROGLYCERIN 0.4 MG SL SUBL: 0.4000 mg | SUBLINGUAL_TABLET | SUBLINGUAL | 1 refills | Status: AC | PRN

## 2020-08-02 NOTE — Patient Instructions (Addendum)
After Visit Summary:  We will be checking the following labs today - NONE   Medication Instructions:    Continue with your current medicines.    If you need a refill on your cardiac medications before your next appointment, please call your pharmacy.     Testing/Procedures To Be Arranged:  N/A  Follow-Up:   See Dr. Marlou Porch in 4 months.     At [facility], you and your health needs are our priority.  As part of our continuing mission to provide you with exceptional heart care, we have created designated Provider Care Teams.  These Care Teams include your primary Cardiologist (physician) and Advanced Practice Providers (APPs -  Physician Assistants and Nurse Practitioners) who all work together to provide you with the care you need, when you need it.  Special Instructions:  . Stay safe, wash your hands for at least 20 seconds and wear a mask when needed.  . It was good to talk with you today.    Call the [place] office at [phone] if you have any questions, problems or concerns.

## 2020-08-18 DIAGNOSIS — I4891 Unspecified atrial fibrillation: Secondary | ICD-10-CM | POA: Diagnosis not present

## 2020-08-18 DIAGNOSIS — F324 Major depressive disorder, single episode, in partial remission: Secondary | ICD-10-CM | POA: Diagnosis not present

## 2020-08-18 DIAGNOSIS — F419 Anxiety disorder, unspecified: Secondary | ICD-10-CM | POA: Diagnosis not present

## 2020-08-18 DIAGNOSIS — Z7901 Long term (current) use of anticoagulants: Secondary | ICD-10-CM | POA: Diagnosis not present

## 2020-08-20 DIAGNOSIS — I10 Essential (primary) hypertension: Secondary | ICD-10-CM | POA: Diagnosis not present

## 2020-08-22 DIAGNOSIS — I10 Essential (primary) hypertension: Secondary | ICD-10-CM | POA: Diagnosis not present

## 2020-08-25 DIAGNOSIS — Z7901 Long term (current) use of anticoagulants: Secondary | ICD-10-CM | POA: Diagnosis not present

## 2020-08-25 DIAGNOSIS — I4891 Unspecified atrial fibrillation: Secondary | ICD-10-CM | POA: Diagnosis not present

## 2020-08-31 DIAGNOSIS — Z7901 Long term (current) use of anticoagulants: Secondary | ICD-10-CM | POA: Diagnosis not present

## 2020-09-07 DIAGNOSIS — Z7901 Long term (current) use of anticoagulants: Secondary | ICD-10-CM | POA: Diagnosis not present

## 2020-09-07 DIAGNOSIS — I4891 Unspecified atrial fibrillation: Secondary | ICD-10-CM | POA: Diagnosis not present

## 2020-09-08 DIAGNOSIS — F329 Major depressive disorder, single episode, unspecified: Secondary | ICD-10-CM | POA: Diagnosis not present

## 2020-09-08 DIAGNOSIS — I4891 Unspecified atrial fibrillation: Secondary | ICD-10-CM | POA: Diagnosis not present

## 2020-09-08 DIAGNOSIS — E782 Mixed hyperlipidemia: Secondary | ICD-10-CM | POA: Diagnosis not present

## 2020-09-08 DIAGNOSIS — E1165 Type 2 diabetes mellitus with hyperglycemia: Secondary | ICD-10-CM | POA: Diagnosis not present

## 2020-09-08 DIAGNOSIS — E1122 Type 2 diabetes mellitus with diabetic chronic kidney disease: Secondary | ICD-10-CM | POA: Diagnosis not present

## 2020-09-08 DIAGNOSIS — I2510 Atherosclerotic heart disease of native coronary artery without angina pectoris: Secondary | ICD-10-CM | POA: Diagnosis not present

## 2020-09-08 DIAGNOSIS — I10 Essential (primary) hypertension: Secondary | ICD-10-CM | POA: Diagnosis not present

## 2020-09-08 DIAGNOSIS — I509 Heart failure, unspecified: Secondary | ICD-10-CM | POA: Diagnosis not present

## 2020-09-08 DIAGNOSIS — N184 Chronic kidney disease, stage 4 (severe): Secondary | ICD-10-CM | POA: Diagnosis not present

## 2020-09-11 ENCOUNTER — Other Ambulatory Visit: Payer: Self-pay | Admitting: Nurse Practitioner

## 2020-09-15 DIAGNOSIS — I5022 Chronic systolic (congestive) heart failure: Secondary | ICD-10-CM | POA: Diagnosis not present

## 2020-09-15 DIAGNOSIS — N1832 Chronic kidney disease, stage 3b: Secondary | ICD-10-CM | POA: Diagnosis not present

## 2020-09-15 DIAGNOSIS — N2581 Secondary hyperparathyroidism of renal origin: Secondary | ICD-10-CM | POA: Diagnosis not present

## 2020-09-15 DIAGNOSIS — I129 Hypertensive chronic kidney disease with stage 1 through stage 4 chronic kidney disease, or unspecified chronic kidney disease: Secondary | ICD-10-CM | POA: Diagnosis not present

## 2020-09-15 DIAGNOSIS — E1122 Type 2 diabetes mellitus with diabetic chronic kidney disease: Secondary | ICD-10-CM | POA: Diagnosis not present

## 2020-09-15 DIAGNOSIS — D631 Anemia in chronic kidney disease: Secondary | ICD-10-CM | POA: Diagnosis not present

## 2020-09-19 DIAGNOSIS — I10 Essential (primary) hypertension: Secondary | ICD-10-CM | POA: Diagnosis not present

## 2020-09-20 ENCOUNTER — Ambulatory Visit: Payer: Medicare HMO | Admitting: Endocrinology

## 2020-09-20 DIAGNOSIS — Z7901 Long term (current) use of anticoagulants: Secondary | ICD-10-CM | POA: Diagnosis not present

## 2020-09-20 DIAGNOSIS — F339 Major depressive disorder, recurrent, unspecified: Secondary | ICD-10-CM | POA: Diagnosis not present

## 2020-09-20 DIAGNOSIS — F419 Anxiety disorder, unspecified: Secondary | ICD-10-CM | POA: Diagnosis not present

## 2020-10-04 DIAGNOSIS — I4891 Unspecified atrial fibrillation: Secondary | ICD-10-CM | POA: Diagnosis not present

## 2020-10-04 DIAGNOSIS — Z7901 Long term (current) use of anticoagulants: Secondary | ICD-10-CM | POA: Diagnosis not present

## 2020-10-11 ENCOUNTER — Other Ambulatory Visit: Payer: Self-pay

## 2020-10-11 ENCOUNTER — Ambulatory Visit: Payer: Medicare HMO | Admitting: Endocrinology

## 2020-10-11 VITALS — BP 112/70 | HR 83 | Ht 70.0 in | Wt 253.6 lb

## 2020-10-11 DIAGNOSIS — N1831 Chronic kidney disease, stage 3a: Secondary | ICD-10-CM

## 2020-10-11 DIAGNOSIS — E1122 Type 2 diabetes mellitus with diabetic chronic kidney disease: Secondary | ICD-10-CM | POA: Diagnosis not present

## 2020-10-11 DIAGNOSIS — Z794 Long term (current) use of insulin: Secondary | ICD-10-CM

## 2020-10-11 LAB — POCT GLYCOSYLATED HEMOGLOBIN (HGB A1C): Hemoglobin A1C: 5.8 % — AB (ref 4.0–5.6)

## 2020-10-11 MED ORDER — INSULIN ASPART 100 UNIT/ML ~~LOC~~ SOLN: SUBCUTANEOUS | 11 refills | Status: DC

## 2020-10-11 MED ORDER — INSULIN ASPART 100 UNIT/ML ~~LOC~~ SOLN: SUBCUTANEOUS | 11 refills | Status: AC

## 2020-10-11 NOTE — Patient Instructions (Addendum)
check your blood sugar twice a day.  vary the time of day when you check, between before the 3 meals, and at bedtime.  also check if you have symptoms of your blood sugar being too high or too low.  please keep a record of the readings and bring it to your next appointment here (or you can bring the meter itself).  You can write it on any piece of paper.  please call us sooner if your blood sugar goes below 70, or if you have a lot of readings over 200. Please reduce the novolog to 14 units with breakfast, and 18 units with supper, Please come back for a follow-up appointment in 4 months.

## 2020-10-11 NOTE — Progress Notes (Signed)
Subjective:    Patient ID: Shane Kelly, male    DOB: 1950/10/27, 70 y.o.   MRN: [id number]  HPI Pt returns for f/u of diabetes mellitus: DM type: Insulin-requiring type 2.   Dx'ed: 123456 Complications: PN, CAD, and stage 4 CRI.  Therapy: insulin since soon after dx DKA: never Severe hypoglycemia: never Pancreatitis: never Pancreatic imaging: never SDOH: pt says he cannot afford GLP med.   Other: he takes multiple daily injections; due to renal failure, he does not need basal insulin; fructosamine has shown similar glycemic control to A1c; He often skips lunch (and lunch insulin).   Interval history: no cbg record, but states cbg's vary from 89-200.  It is in general lowest in the afternoon, when he feels weak.  No recent steroids. He seldom misses the insulin.   Past Medical History:  Diagnosis Date  . Diabetes ([place])   . Heart attack ([place])   . Hyperlipidemia   . Hypertension   . Irregular heart beat   . Kidney disease     Past Surgical History:  Procedure Laterality Date  . LEFT KNEE REPLACEMENT    . RIGHT HIP REPLACEMENT    . Erasmo Downer  2008/2014    Social History   Socioeconomic History  . Marital status: Married    Spouse name: Not on file  . Number of children: Not on file  . Years of education: Not on file  . Highest education level: Not on file  Occupational History  . Not on file  Tobacco Use  . Smoking status: Never Smoker  . Smokeless tobacco: Never Used  Vaping Use  . Vaping Use: Never used  Substance and Sexual Activity  . Alcohol use: Yes    Alcohol/week: 0.0 standard drinks  . Drug use: No  . Sexual activity: Not on file  Other Topics Concern  . Not on file  Social History Narrative  . Not on file   Social Determinants of Health   Financial Resource Strain: Not on file  Food Insecurity: Not on file  Transportation Needs: Not on file  Physical Activity: Not on file  Stress: Not on file  Social Connections: Not on file  Intimate  Partner Violence: Not on file    Current Outpatient Medications on File Prior to Visit  Medication Sig Dispense Refill  . ACCU-CHEK SOFTCLIX LANCETS lancets Use as instructed to check blood sugar twice daily 100 each 12  . allopurinol (ZYLOPRIM) 300 MG tablet Take 300 mg by mouth daily.    Marland Kitchen atorvastatin (LIPITOR) 80 MG tablet Take 80 mg by mouth daily.    Marland Kitchen buPROPion (WELLBUTRIN SR) 150 MG 12 hr tablet Take 300 mg by mouth daily. 2 tabs daily    . carvedilol (COREG) 25 MG tablet TAKE 1 TABLET (25 MG TOTAL) BY MOUTH 2 (TWO) TIMES DAILY. 180 tablet 3  . docusate sodium (COLACE) 100 MG capsule Take 100 mg by mouth daily as needed for mild constipation.    Marland Kitchen ezetimibe (ZETIA) 10 MG tablet Take 10 mg by mouth daily.    . fenofibrate 54 MG tablet Take 54 mg by mouth daily.    Marland Kitchen glucose blood (ACCU-CHEK AVIVA PLUS) test strip 1 each by Other route in the morning and at bedtime. And lancets 2/day 100 each 12  . HYDROcodone-acetaminophen (NORCO/VICODIN) 5-325 MG tablet as needed.    . isosorbide mononitrate (IMDUR) 60 MG 24 hr tablet TAKE 1 TABLET EVERY DAY 90 tablet 2  . losartan (COZAAR) 100 MG  tablet Take 100 mg by mouth daily.    . nitroGLYCERIN (NITROSTAT) 0.4 MG SL tablet Place 1 tablet (0.4 mg total) under the tongue every 5 (five) minutes as needed for chest pain. 100 tablet 1  . warfarin (COUMADIN) 2.5 MG tablet Take 2.5 mg by mouth daily. TAKE 1 TABLET MON, WED, FRI AND A 1/2 TABLET TUES, THURS, SAT AND SUN     No current facility-administered medications on file prior to visit.    No Known Allergies  Family History  Problem Relation Age of Onset  . Diabetes Father   . Heart disease Father   . Heart disease Mother   . Cancer Neg Hx     BP 112/70 (BP Location: Right Arm, Patient Position: Sitting, Cuff Size: Large)   Pulse 83   Ht '5\' 10"'$  (1.778 m)   Wt 253 lb 9.6 oz (115 kg)   SpO2 98%   BMI 36.39 kg/m    Review of Systems He has lost 19 lbs since last ov  here--intentional.      Objective:   Physical Exam VITAL SIGNS:  See vs page GENERAL: no distress SKIN: no ulcer on the feet; severe rust-colored hyperpigmentation of the legs. CV: 2+ bilat leg edema, and bilat large vv's (R>L).   EXTEMITIES: no deformity.  feet are of normal temp.  There is bilateral onychomycosis of the toenails.   PULSES: dorsalis pedis absent bilat (poss due to edema).   NEURO:  sensation is intact to touch on the feet, but decreased from normal.    A1c=5.8%    Assessment & Plan:  Insulin-requiring type 2 DM, with stage 4 CRI: overcontrolled, due to weight loss.    Patient Instructions  check your blood sugar twice a day.  vary the time of day when you check, between before the 3 meals, and at bedtime.  also check if you have symptoms of your blood sugar being too high or too low.  please keep a record of the readings and bring it to your next appointment here (or you can bring the meter itself).  You can write it on any piece of paper.  please call us sooner if your blood sugar goes below 70, or if you have a lot of readings over 200. Please reduce the novolog to 14 units with breakfast, and 18 units with supper, Please come back for a follow-up appointment in 4 months.

## 2020-10-18 DIAGNOSIS — I4891 Unspecified atrial fibrillation: Secondary | ICD-10-CM | POA: Diagnosis not present

## 2020-10-18 DIAGNOSIS — Z7901 Long term (current) use of anticoagulants: Secondary | ICD-10-CM | POA: Diagnosis not present

## 2020-10-20 DIAGNOSIS — I10 Essential (primary) hypertension: Secondary | ICD-10-CM | POA: Diagnosis not present

## 2020-11-17 DIAGNOSIS — E782 Mixed hyperlipidemia: Secondary | ICD-10-CM | POA: Diagnosis not present

## 2020-11-17 DIAGNOSIS — I25119 Atherosclerotic heart disease of native coronary artery with unspecified angina pectoris: Secondary | ICD-10-CM | POA: Diagnosis not present

## 2020-11-17 DIAGNOSIS — I4891 Unspecified atrial fibrillation: Secondary | ICD-10-CM | POA: Diagnosis not present

## 2020-11-17 DIAGNOSIS — N184 Chronic kidney disease, stage 4 (severe): Secondary | ICD-10-CM | POA: Diagnosis not present

## 2020-11-17 DIAGNOSIS — I509 Heart failure, unspecified: Secondary | ICD-10-CM | POA: Diagnosis not present

## 2020-11-17 DIAGNOSIS — F339 Major depressive disorder, recurrent, unspecified: Secondary | ICD-10-CM | POA: Diagnosis not present

## 2020-11-17 DIAGNOSIS — I10 Essential (primary) hypertension: Secondary | ICD-10-CM | POA: Diagnosis not present

## 2020-11-17 DIAGNOSIS — E1165 Type 2 diabetes mellitus with hyperglycemia: Secondary | ICD-10-CM | POA: Diagnosis not present

## 2020-11-17 DIAGNOSIS — E1122 Type 2 diabetes mellitus with diabetic chronic kidney disease: Secondary | ICD-10-CM | POA: Diagnosis not present

## 2020-11-18 DIAGNOSIS — I10 Essential (primary) hypertension: Secondary | ICD-10-CM | POA: Diagnosis not present

## 2020-11-22 DIAGNOSIS — F324 Major depressive disorder, single episode, in partial remission: Secondary | ICD-10-CM | POA: Diagnosis not present

## 2020-11-22 DIAGNOSIS — I4891 Unspecified atrial fibrillation: Secondary | ICD-10-CM | POA: Diagnosis not present

## 2020-11-22 DIAGNOSIS — N184 Chronic kidney disease, stage 4 (severe): Secondary | ICD-10-CM | POA: Diagnosis not present

## 2020-11-22 DIAGNOSIS — F419 Anxiety disorder, unspecified: Secondary | ICD-10-CM | POA: Diagnosis not present

## 2020-11-22 DIAGNOSIS — E782 Mixed hyperlipidemia: Secondary | ICD-10-CM | POA: Diagnosis not present

## 2020-11-22 DIAGNOSIS — I25119 Atherosclerotic heart disease of native coronary artery with unspecified angina pectoris: Secondary | ICD-10-CM | POA: Diagnosis not present

## 2020-11-22 DIAGNOSIS — Z7901 Long term (current) use of anticoagulants: Secondary | ICD-10-CM | POA: Diagnosis not present

## 2020-11-22 DIAGNOSIS — I509 Heart failure, unspecified: Secondary | ICD-10-CM | POA: Diagnosis not present

## 2020-11-22 DIAGNOSIS — I10 Essential (primary) hypertension: Secondary | ICD-10-CM | POA: Diagnosis not present

## 2020-11-28 ENCOUNTER — Other Ambulatory Visit: Payer: Self-pay | Admitting: *Deleted

## 2020-11-28 DIAGNOSIS — I469 Cardiac arrest, cause unspecified: Secondary | ICD-10-CM | POA: Diagnosis not present

## 2020-11-28 MED ORDER — ISOSORBIDE MONONITRATE ER 60 MG PO TB24: 60.0000 mg | ORAL_TABLET | Freq: Every day | ORAL | 2 refills | Status: AC

## 2020-11-29 ENCOUNTER — Ambulatory Visit: Payer: Medicare HMO | Admitting: Cardiology

## 2020-12-20 ENCOUNTER — Ambulatory Visit: Payer: Medicare HMO | Admitting: Cardiology

## 2020-12-21 DEATH — deceased

## 2021-02-14 ENCOUNTER — Ambulatory Visit: Payer: Medicare HMO | Admitting: Endocrinology
